# Patient Record
Sex: Female | Born: 1995
Health system: Southern US, Community
[De-identification: ages and names within clinical notes are randomized; demographics above are authoritative.]

## PROBLEM LIST (undated history)

## (undated) DIAGNOSIS — K219 Gastro-esophageal reflux disease without esophagitis: Secondary | ICD-10-CM

## (undated) DIAGNOSIS — F419 Anxiety disorder, unspecified: Secondary | ICD-10-CM

## (undated) DIAGNOSIS — K589 Irritable bowel syndrome without diarrhea: Secondary | ICD-10-CM

## (undated) DIAGNOSIS — I10 Essential (primary) hypertension: Secondary | ICD-10-CM

## (undated) DIAGNOSIS — E669 Obesity, unspecified: Secondary | ICD-10-CM

## (undated) DIAGNOSIS — A749 Chlamydial infection, unspecified: Secondary | ICD-10-CM

## (undated) DIAGNOSIS — J45909 Unspecified asthma, uncomplicated: Secondary | ICD-10-CM

## (undated) HISTORY — DX: Chlamydial infection, unspecified: A74.9

## (undated) HISTORY — DX: Gastro-esophageal reflux disease without esophagitis: K21.9

## (undated) HISTORY — DX: Obesity, unspecified: E66.9

## (undated) HISTORY — DX: Anxiety disorder, unspecified: F41.9

## (undated) HISTORY — DX: Irritable bowel syndrome, unspecified: K58.9

## (undated) HISTORY — DX: Essential (primary) hypertension: I10

---
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
## 2015-01-07 ENCOUNTER — Encounter: Payer: Self-pay | Admitting: Emergency Medicine

## 2015-01-07 ENCOUNTER — Emergency Department
Admission: EM | Admit: 2015-01-07 | Discharge: 2015-01-08 | Disposition: A | Discharge status: Home / Self Care | Payer: Medicaid Other | Attending: Emergency Medicine | Admitting: Emergency Medicine

## 2015-01-07 DIAGNOSIS — I10 Essential (primary) hypertension: Secondary | ICD-10-CM | POA: Diagnosis not present

## 2015-01-07 DIAGNOSIS — Z79899 Other long term (current) drug therapy: Secondary | ICD-10-CM | POA: Insufficient documentation

## 2015-01-07 DIAGNOSIS — J40 Bronchitis, not specified as acute or chronic: Secondary | ICD-10-CM

## 2015-01-07 DIAGNOSIS — J4521 Mild intermittent asthma with (acute) exacerbation: Secondary | ICD-10-CM

## 2015-01-07 DIAGNOSIS — R05 Cough: Secondary | ICD-10-CM | POA: Diagnosis present

## 2015-01-07 NOTE — ED Notes (Signed)
Patient ambulatory to triage with steady gait, without difficulty or distress noted; pt reports productive cough green sputum x 4 days; denies fever

## 2015-01-08 MED ORDER — ALBUTEROL SULFATE HFA 108 (90 BASE) MCG/ACT IN AERS: 2.0000 | INHALATION_SPRAY | RESPIRATORY_TRACT | Status: DC | PRN

## 2015-01-08 MED ORDER — PREDNISONE 20 MG PO TABS: ORAL_TABLET | ORAL | Status: DC

## 2015-01-08 MED ORDER — AZITHROMYCIN 250 MG PO TABS: 250.0000 mg | ORAL_TABLET | Freq: Every day | ORAL | Status: DC

## 2015-01-08 MED ORDER — PREDNISONE 20 MG PO TABS
60.0000 mg | ORAL_TABLET | Freq: Once | ORAL | Status: AC
  Administered 2015-01-08: 60 mg via ORAL
  Filled 2015-01-08: qty 3

## 2015-01-08 MED ORDER — HYDROCOD POLST-CPM POLST ER 10-8 MG/5ML PO SUER
5.0000 mL | Freq: Once | ORAL | Status: AC
  Administered 2015-01-08: 5 mL via ORAL
  Filled 2015-01-08: qty 5

## 2015-01-08 MED ORDER — IPRATROPIUM-ALBUTEROL 0.5-2.5 (3) MG/3ML IN SOLN
3.0000 mL | Freq: Once | RESPIRATORY_TRACT | Status: AC
  Administered 2015-01-08: 3 mL via RESPIRATORY_TRACT
  Filled 2015-01-08: qty 3

## 2015-01-08 MED ORDER — HYDROCOD POLST-CPM POLST ER 10-8 MG/5ML PO SUER: 5.0000 mL | Freq: Two times a day (BID) | ORAL | Status: DC

## 2015-01-08 MED ORDER — AZITHROMYCIN 250 MG PO TABS
500.0000 mg | ORAL_TABLET | Freq: Once | ORAL | Status: AC
  Administered 2015-01-08: 500 mg via ORAL
  Filled 2015-01-08: qty 2

## 2015-01-08 NOTE — ED Provider Notes (Signed)
[facility] Emergency Department Provider Note  ____________________________________________  Time seen: Approximately 1:25 AM  I have reviewed the triage vital signs and the nursing notes.   HISTORY  Chief Complaint Cough    HPI Becky Jackson is a 19 y.o. female who presents to the ED from home with a chief complain of cough, runny nose and congestion. Notes symptoms 4 days. Taking over-the-counter medicines without relief. Patient has a history of "childhood asthma". Complains of cough productive of green sputum, right ear pain and sore throat. Denies associated fever, chills, chest pain, shortness of breath, abdominal pain, nausea, vomiting, diarrhea.+ sick contacts. Denies recent travel or trauma. Nothing makes her symptoms better or worse.   Past Medical History  Diagnosis Date  . Acid reflux   . Hypertension     Patient Active Problem List   Diagnosis Date Noted  . Anemia 11/16/2012  . Chronic cholecystitis 10/26/2012  . Abdominal pain, other specified site 10/25/2012    Past Surgical History  Procedure Laterality Date  . Cholecystectomy  2014    Current Outpatient Rx  Name  Route  Sig  Dispense  Refill  . Albuterol Sulfate (PROAIR HFA IN)   Inhalation   Inhale into the lungs.         Marland Kitchen atenolol (TENORMIN) 25 MG tablet   Oral   Take 25 mg by mouth daily.         . benzonatate (TESSALON PERLES) 100 MG capsule      1-2 q 8 hours prn cough   30 capsule   0   . ferrous fumarate-iron polysaccharide complex (TANDEM) 162-115.2 MG CAPS   Oral   Take 1 capsule by mouth daily with breakfast.   30 capsule   0   . montelukast (SINGULAIR) 10 MG tablet   Oral   Take 10 mg by mouth as needed.         . norelgestromin-ethinyl estradiol (ORTHO EVRA) 150-35 MCG/24HR transdermal patch   Transdermal   Place 1 patch onto the skin once a week.         Marland Kitchen omeprazole (PRILOSEC) 20 MG capsule   Oral   Take 20 mg by mouth daily.           Allergies Augmentin and Shellfish allergy  No family history on file.  Social History Social History  Substance Use Topics  . Smoking status: Never Smoker   . Smokeless tobacco: Never Used  . Alcohol Use: No    Review of Systems Constitutional: No fever/chills Eyes: No visual changes. ENT: Positive for sore throat. Cardiovascular: Denies chest pain. Respiratory: Positive for productive cough and wheezing. Denies shortness of breath. Gastrointestinal: No abdominal pain.  No nausea, no vomiting.  No diarrhea.  No constipation. Genitourinary: Negative for dysuria. Musculoskeletal: Negative for back pain. Skin: Negative for rash. Neurological: Negative for headaches, focal weakness or numbness.  10-point ROS otherwise negative.  ____________________________________________   PHYSICAL EXAM:  VITAL SIGNS: ED Triage Vitals  Enc Vitals Group     BP 01/07/15 2257 139/84 mmHg     Pulse Rate 01/07/15 2257 110     Resp 01/07/15 2257 20     Temp 01/07/15 2257 97.6 F (36.4 C)     Temp Source 01/07/15 2257 Oral     SpO2 01/07/15 2257 99 %     Weight 01/07/15 2257 210 lb (95.255 kg)     Height 01/07/15 2257 5\' 3"  (1.6 m)     Head Cir --  Peak Flow --      Pain Score 01/08/15 0032 6     Pain Loc --      Pain Edu? --      Excl. in [place]? --     Constitutional: Alert and oriented. Well appearing and in no acute distress. Eyes: Conjunctivae are normal. PERRL. EOMI. Ears: Bilateral TM dullness. Head: Atraumatic. Nose: Congestion/rhinnorhea. Mouth/Throat: Mucous membranes are moist.  Oropharynx erythematous.  There is no tonsillar swelling, exudate or peritonsillar abscess. There is no muffled voice or drooling. Slightly hoarse voice. Neck: No stridor.   Hematological/Lymphatic/Immunilogical: [place] anterior cervical lymphadenopathy. Cardiovascular: Normal rate, regular rhythm. Grossly normal heart sounds.  Good peripheral circulation. Respiratory: Normal  respiratory effort.  No retractions. Lungs with scattered wheezing bilaterally. Gastrointestinal: Soft and nontender. No distention. No abdominal bruits. No CVA tenderness. Musculoskeletal: No lower extremity tenderness nor edema.  No joint effusions. Neurologic:  Normal speech and language. No gross focal neurologic deficits are appreciated. No gait instability. Skin:  Skin is warm, dry and intact. No rash noted. Psychiatric: Mood and affect are normal. Speech and behavior are normal.  ____________________________________________   LABS (all labs ordered are listed, but only abnormal results are displayed)  Labs Reviewed - No data to display ____________________________________________  EKG  None ____________________________________________  RADIOLOGY  None ____________________________________________   PROCEDURES  Procedure(s) performed: None  Critical Care performed: No  ____________________________________________   INITIAL IMPRESSION / ASSESSMENT AND PLAN / ED COURSE  Pertinent labs & imaging results that were available during my care of the patient were reviewed by me and considered in my medical decision making (see chart for details).  19 year old female with a history of asthma who presents with cough productive of green sputum and wheezing. Will initiate nebulizer treatment, oral prednisone, azithromycin and Tussionex for cough.  ----------------------------------------- 3:03 AM on 01/08/2015 -----------------------------------------  Patient is feeling much better after nebulizer treatment. Room air saturations 98%. Wheezing greatly improved. Discussed with patient and given strict return precautions. Patient verbalizes understanding and agrees with plan of care. ____________________________________________   FINAL CLINICAL IMPRESSION(S) / ED DIAGNOSES  Final diagnoses:  Asthma, mild intermittent, with acute exacerbation  Bronchitis      Paulette Blanch, MD 01/08/15 [phone]

## 2015-01-08 NOTE — ED Notes (Signed)
meds given/  Pt receiving breathing treatment.

## 2015-01-08 NOTE — ED Notes (Signed)
States feeling better after medicines.  Pt alert.

## 2015-01-08 NOTE — Discharge Instructions (Signed)
1. Finish steroid as prescribed (prednisone 60 mg daily 4 days). 2. Finish antibiotic as prescribed (azithromycin 250 mg daily 4 days). 3. Take cough medicine as needed (Tussionex). 4. Use albuterol inhaler 2 puffs every 4 hours as needed for wheezing. 5. Return to the ER for worsening symptoms, persistent vomiting, difficulty breathing or other concerns.  Asthma, Adult Asthma is a recurring condition in which the airways tighten and narrow. Asthma can make it difficult to breathe. It can cause coughing, wheezing, and shortness of breath. Asthma episodes, also called asthma attacks, range from minor to life-threatening. Asthma cannot be cured, but medicines and lifestyle changes can help control it. CAUSES Asthma is believed to be caused by inherited (genetic) and environmental factors, but its exact cause is unknown. Asthma may be triggered by allergens, lung infections, or irritants in the air. Asthma triggers are different for each person. Common triggers include:   Animal dander.  Dust mites.  Cockroaches.  Pollen from trees or grass.  Mold.  Smoke.  Air pollutants such as dust, household cleaners, hair sprays, aerosol sprays, paint fumes, strong chemicals, or strong odors.  Cold air, weather changes, and winds (which increase molds and pollens in the air).  Strong emotional expressions such as crying or laughing hard.  Stress.  Certain medicines (such as aspirin) or types of drugs (such as beta-blockers).  Sulfites in foods and drinks. Foods and drinks that may contain sulfites include dried fruit, potato chips, and sparkling grape juice.  Infections or inflammatory conditions such as the flu, a cold, or an inflammation of the nasal membranes (rhinitis).  Gastroesophageal reflux disease (GERD).  Exercise or strenuous activity. SYMPTOMS Symptoms may occur immediately after asthma is triggered or many hours later. Symptoms include:  Wheezing.  Excessive nighttime or  early morning coughing.  Frequent or severe coughing with a common cold.  Chest tightness.  Shortness of breath. DIAGNOSIS  The diagnosis of asthma is made by a review of your medical history and a physical exam. Tests may also be performed. These may include:  Lung function studies. These tests show how much air you breathe in and out.  Allergy tests.  Imaging tests such as X-rays. TREATMENT  Asthma cannot be cured, but it can usually be controlled. Treatment involves identifying and avoiding your asthma triggers. It also involves medicines. There are 2 classes of medicine used for asthma treatment:   Controller medicines. These prevent asthma symptoms from occurring. They are usually taken every day.  Reliever or rescue medicines. These quickly relieve asthma symptoms. They are used as needed and provide short-term relief. Your health care provider will help you create an asthma action plan. An asthma action plan is a written plan for managing and treating your asthma attacks. It includes a list of your asthma triggers and how they may be avoided. It also includes information on when medicines should be taken and when their dosage should be changed. An action plan may also involve the use of a device called a peak flow meter. A peak flow meter measures how well the lungs are working. It helps you monitor your condition. HOME CARE INSTRUCTIONS   Take medicines only as directed by your health care provider. Speak with your health care provider if you have questions about how or when to take the medicines.  Use a peak flow meter as directed by your health care provider. Record and keep track of readings.  Understand and use the action plan to help minimize or stop an  asthma attack without needing to seek medical care.  Control your home environment in the following ways to help prevent asthma attacks:  Do not smoke. Avoid being exposed to secondhand smoke.  Change your heating and  air conditioning filter regularly.  Limit your use of fireplaces and wood stoves.  Get rid of pests (such as roaches and mice) and their droppings.  Throw away plants if you see mold on them.  Clean your floors and dust regularly. Use unscented cleaning products.  Try to have someone else vacuum for you regularly. Stay out of rooms while they are being vacuumed and for a short while afterward. If you vacuum, use a dust mask from a hardware store, a double-layered or microfilter vacuum cleaner bag, or a vacuum cleaner with a HEPA filter.  Replace carpet with wood, tile, or vinyl flooring. Carpet can trap dander and dust.  Use allergy-proof pillows, mattress covers, and box spring covers.  Wash bed sheets and blankets every week in hot water and dry them in a dryer.  Use blankets that are made of polyester or cotton.  Clean bathrooms and kitchens with bleach. If possible, have someone repaint the walls in these rooms with mold-resistant paint. Keep out of the rooms that are being cleaned and painted.  Wash hands frequently. SEEK MEDICAL CARE IF:   You have wheezing, shortness of breath, or a cough even if taking medicine to prevent attacks.  The colored mucus you cough up (sputum) is thicker than usual.  Your sputum changes from clear or white to yellow, green, gray, or bloody.  You have any problems that may be related to the medicines you are taking (such as a rash, itching, swelling, or trouble breathing).  You are using a reliever medicine more than 2-3 times per week.  Your peak flow is still at 50-79% of your personal best after following your action plan for 1 hour.  You have a fever. SEEK IMMEDIATE MEDICAL CARE IF:   You seem to be getting worse and are unresponsive to treatment during an asthma attack.  You are short of breath even at rest.  You get short of breath when doing very little physical activity.  You have difficulty eating, drinking, or talking due to  asthma symptoms.  You develop chest pain.  You develop a fast heartbeat.  You have a bluish color to your lips or fingernails.  You are light-headed, dizzy, or faint.  Your peak flow is less than 50% of your personal best.   This information is not intended to replace advice given to you by your health care provider. Make sure you discuss any questions you have with your health care provider.   Document Released: 01/13/2005 Document Revised: 10/04/2014 Document Reviewed: 08/12/2012 Elsevier Interactive Patient Education 2016 [employer]  Cough, Adult Coughing is a reflex that clears your throat and your airways. Coughing helps to heal and protect your lungs. It is normal to cough occasionally, but a cough that happens with other symptoms or lasts a long time may be a sign of a condition that needs treatment. A cough may last only 2-3 weeks (acute), or it may last longer than 8 weeks (chronic). CAUSES Coughing is commonly caused by:  Breathing in substances that irritate your lungs.  A viral or bacterial respiratory infection.  Allergies.  Asthma.  Postnasal drip.  Smoking.  Acid backing up from the stomach into the esophagus (gastroesophageal reflux).  Certain medicines.  Chronic lung problems, including COPD (or rarely,  lung cancer).  Other medical conditions such as heart failure. HOME CARE INSTRUCTIONS  Pay attention to any changes in your symptoms. Take these actions to help with your discomfort:  Take medicines only as told by your health care provider.  If you were prescribed an antibiotic medicine, take it as told by your health care provider. Do not stop taking the antibiotic even if you start to feel better.  Talk with your health care provider before you take a cough suppressant medicine.  Drink enough fluid to keep your urine clear or pale yellow.  If the air is dry, use a cold steam vaporizer or humidifier in your bedroom or your home to help loosen  secretions.  Avoid anything that causes you to cough at work or at home.  If your cough is worse at night, try sleeping in a semi-upright position.  Avoid cigarette smoke. If you smoke, quit smoking. If you need help quitting, ask your health care provider.  Avoid caffeine.  Avoid alcohol.  Rest as needed. SEEK MEDICAL CARE IF:   You have new symptoms.  You cough up pus.  Your cough does not get better after 2-3 weeks, or your cough gets worse.  You cannot control your cough with suppressant medicines and you are losing sleep.  You develop pain that is getting worse or pain that is not controlled with pain medicines.  You have a fever.  You have unexplained weight loss.  You have night sweats. SEEK IMMEDIATE MEDICAL CARE IF:  You cough up blood.  You have difficulty breathing.  Your heartbeat is very fast.   This information is not intended to replace advice given to you by your health care provider. Make sure you discuss any questions you have with your health care provider.   Document Released: 07/12/2010 Document Revised: 10/04/2014 Document Reviewed: 03/22/2014 Elsevier Interactive Patient Education [employer].

## 2015-01-08 NOTE — ED Notes (Signed)
Pt has a cough, runny nose and congestion.  Sx for 4 days.  Pt taking otc meds without relief.  Nonsmoker.  No fever .  Pt alert.

## 2015-03-21 ENCOUNTER — Encounter: Payer: Self-pay | Admitting: Emergency Medicine

## 2015-03-21 ENCOUNTER — Emergency Department
Admission: EM | Admit: 2015-03-21 | Discharge: 2015-03-21 | Disposition: A | Discharge status: Home / Self Care | Payer: Medicaid Other | Attending: Emergency Medicine | Admitting: Emergency Medicine

## 2015-03-21 DIAGNOSIS — R1013 Epigastric pain: Secondary | ICD-10-CM | POA: Diagnosis not present

## 2015-03-21 DIAGNOSIS — Z79899 Other long term (current) drug therapy: Secondary | ICD-10-CM | POA: Insufficient documentation

## 2015-03-21 DIAGNOSIS — Z792 Long term (current) use of antibiotics: Secondary | ICD-10-CM | POA: Insufficient documentation

## 2015-03-21 DIAGNOSIS — J111 Influenza due to unidentified influenza virus with other respiratory manifestations: Secondary | ICD-10-CM | POA: Diagnosis not present

## 2015-03-21 DIAGNOSIS — R05 Cough: Secondary | ICD-10-CM | POA: Diagnosis present

## 2015-03-21 DIAGNOSIS — Z3202 Encounter for pregnancy test, result negative: Secondary | ICD-10-CM | POA: Diagnosis not present

## 2015-03-21 DIAGNOSIS — Z793 Long term (current) use of hormonal contraceptives: Secondary | ICD-10-CM | POA: Diagnosis not present

## 2015-03-21 DIAGNOSIS — I10 Essential (primary) hypertension: Secondary | ICD-10-CM | POA: Diagnosis not present

## 2015-03-21 DIAGNOSIS — R69 Illness, unspecified: Secondary | ICD-10-CM

## 2015-03-21 LAB — COMPREHENSIVE METABOLIC PANEL
ALBUMIN: 4.5 g/dL (ref 3.5–5.0)
ALK PHOS: 102 U/L (ref 38–126)
ALT: 28 U/L (ref 14–54)
AST: 19 U/L (ref 15–41)
Anion gap: 10 (ref 5–15)
BILIRUBIN TOTAL: 0.5 mg/dL (ref 0.3–1.2)
BUN: 18 mg/dL (ref 6–20)
CO2: 20 mmol/L — ABNORMAL LOW (ref 22–32)
CREATININE: 0.76 mg/dL (ref 0.44–1.00)
Calcium: 9.2 mg/dL (ref 8.9–10.3)
Chloride: 108 mmol/L (ref 101–111)
GFR calc Af Amer: >60 mL/min (ref >60)
Glucose, Bld: 117 mg/dL — ABNORMAL HIGH (ref 65–99)
POTASSIUM: 3.9 mmol/L (ref 3.5–5.1)
Sodium: 138 mmol/L (ref 135–145)
TOTAL PROTEIN: 7.9 g/dL (ref 6.5–8.1)

## 2015-03-21 LAB — CBC
HCT: 44.8 % (ref 35.0–47.0)
Hemoglobin: 14.6 g/dL (ref 12.0–16.0)
MCH: 27.7 pg (ref 26.0–34.0)
MCHC: 32.6 g/dL (ref 32.0–36.0)
MCV: 84.9 fL (ref 80.0–100.0)
PLATELETS: 267 10*3/uL (ref 150–440)
RBC: 5.28 MIL/uL — ABNORMAL HIGH (ref 3.80–5.20)
RDW: 14.3 % (ref 11.5–14.5)
WBC: 12.9 10*3/uL — AB (ref 3.6–11.0)

## 2015-03-21 LAB — URINALYSIS COMPLETE WITH MICROSCOPIC (ARMC ONLY)
Bacteria, UA: NONE SEEN
Bilirubin Urine: NEGATIVE
GLUCOSE, UA: NEGATIVE mg/dL
Hgb urine dipstick: NEGATIVE
KETONES UR: NEGATIVE mg/dL
NITRITE: NEGATIVE
Protein, ur: NEGATIVE mg/dL
SPECIFIC GRAVITY, URINE: 1.027 (ref 1.005–1.030)
pH: 5 (ref 5.0–8.0)

## 2015-03-21 LAB — LIPASE, BLOOD: Lipase: 22 U/L (ref 11–51)

## 2015-03-21 LAB — POCT PREGNANCY, URINE: PREG TEST UR: NEGATIVE

## 2015-03-21 MED ORDER — ONDANSETRON 8 MG PO TBDP
8.0000 mg | ORAL_TABLET | Freq: Once | ORAL | Status: AC
  Administered 2015-03-21: 8 mg via ORAL
  Filled 2015-03-21: qty 1

## 2015-03-21 MED ORDER — GI COCKTAIL ~~LOC~~
30.0000 mL | ORAL | Status: AC
  Administered 2015-03-21: 30 mL via ORAL
  Filled 2015-03-21: qty 30

## 2015-03-21 MED ORDER — ONDANSETRON 4 MG PO TBDP
4.0000 mg | ORAL_TABLET | Freq: Once | ORAL | Status: AC
  Administered 2015-03-21: 4 mg via ORAL

## 2015-03-21 MED ORDER — OXYCODONE-ACETAMINOPHEN 5-325 MG PO TABS
1.0000 | ORAL_TABLET | Freq: Once | ORAL | Status: AC
  Administered 2015-03-21: 1 via ORAL

## 2015-03-21 MED ORDER — RANITIDINE HCL 150 MG PO CAPS: 150.0000 mg | ORAL_CAPSULE | Freq: Two times a day (BID) | ORAL | Status: DC

## 2015-03-21 MED ORDER — ONDANSETRON 8 MG PO TBDP: 8.0000 mg | ORAL_TABLET | Freq: Three times a day (TID) | ORAL | Status: DC | PRN

## 2015-03-21 MED ORDER — FAMOTIDINE 20 MG PO TABS
40.0000 mg | ORAL_TABLET | Freq: Once | ORAL | Status: AC
  Administered 2015-03-21: 40 mg via ORAL
  Filled 2015-03-21: qty 2

## 2015-03-21 MED ORDER — NITROFURANTOIN MACROCRYSTAL 100 MG PO CAPS: 100.0000 mg | ORAL_CAPSULE | Freq: Two times a day (BID) | ORAL | Status: DC

## 2015-03-21 MED ORDER — OXYCODONE-ACETAMINOPHEN 5-325 MG PO TABS
ORAL_TABLET | ORAL | Status: AC
  Filled 2015-03-21: qty 1

## 2015-03-21 NOTE — Discharge Instructions (Signed)
Abdominal Pain, Adult Many things can cause abdominal pain. Usually, abdominal pain is not caused by a disease and will improve without treatment. It can often be observed and treated at home. Your health care provider will do a physical exam and possibly order blood tests and X-rays to help determine the seriousness of your pain. However, in many cases, more time must pass before a clear cause of the pain can be found. Before that point, your health care provider may not know if you need more testing or further treatment. HOME CARE INSTRUCTIONS Monitor your abdominal pain for any changes. The following actions may help to alleviate any discomfort you are experiencing:  Only take over-the-counter or prescription medicines as directed by your health care provider.  Do not take laxatives unless directed to do so by your health care provider.  Try a clear liquid diet (broth, tea, or water) as directed by your health care provider. Slowly move to a bland diet as tolerated. SEEK MEDICAL CARE IF:  You have unexplained abdominal pain.  You have abdominal pain associated with nausea or diarrhea.  You have pain when you urinate or have a bowel movement.  You experience abdominal pain that wakes you in the night.  You have abdominal pain that is worsened or improved by eating food.  You have abdominal pain that is worsened with eating fatty foods.  You have a fever. SEEK IMMEDIATE MEDICAL CARE IF:  Your pain does not go away within 2 hours.  You keep throwing up (vomiting).  Your pain is felt only in portions of the abdomen, such as the right side or the left lower portion of the abdomen.  You pass bloody or black tarry stools. MAKE SURE YOU:  Understand these instructions.  Will watch your condition.  Will get help right away if you are not doing well or get worse.   This information is not intended to replace advice given to you by your health care provider. Make sure you discuss  any questions you have with your health care provider.   Document Released: 10/23/2004 Document Revised: 10/04/2014 Document Reviewed: 09/22/2012 Elsevier Interactive Patient Education 2016 [employer]  Influenza, Adult Influenza ("the flu") is a viral infection of the respiratory tract. It occurs more often in winter months because people spend more time in close contact with one another. Influenza can make you feel very sick. Influenza easily spreads from person to person (contagious). CAUSES  Influenza is caused by a virus that infects the respiratory tract. You can catch the virus by breathing in droplets from an infected person's cough or sneeze. You can also catch the virus by touching something that was recently contaminated with the virus and then touching your mouth, nose, or eyes. RISKS AND COMPLICATIONS You may be at risk for a more severe case of influenza if you smoke cigarettes, have diabetes, have chronic heart disease (such as heart failure) or lung disease (such as asthma), or if you have a weakened immune system. Elderly people and pregnant women are also at risk for more serious infections. The most common problem of influenza is a lung infection (pneumonia). Sometimes, this problem can require emergency medical care and may be life threatening. SIGNS AND SYMPTOMS  Symptoms typically last 4 to 10 days and may include:  Fever.  Chills.  Headache, body aches, and muscle aches.  Sore throat.  Chest discomfort and cough.  Poor appetite.  Weakness or feeling tired.  Dizziness.  Nausea or vomiting. DIAGNOSIS  Diagnosis of influenza is often made based on your history and a physical exam. A nose or throat swab test can be done to confirm the diagnosis. TREATMENT  In mild cases, influenza goes away on its own. Treatment is directed at relieving symptoms. For more severe cases, your health care provider may prescribe antiviral medicines to shorten the sickness.  Antibiotic medicines are not effective because the infection is caused by a virus, not by bacteria. HOME CARE INSTRUCTIONS  Take medicines only as directed by your health care provider.  Use a cool mist humidifier to make breathing easier.  Get plenty of rest until your temperature returns to normal. This usually takes 3 to 4 days.  Drink enough fluid to keep your urine clear or pale yellow.  Cover yourmouth and nosewhen coughing or sneezing,and wash your handswellto prevent thevirusfrom spreading.  Stay homefromwork orschool untilthe fever is gonefor at least 44full day. PREVENTION  An annual influenza vaccination (flu shot) is the best way to avoid getting influenza. An annual flu shot is now routinely recommended for all adults in the [place] IF:  You experiencechest pain, yourcough worsens,or you producemore mucus.  Youhave nausea,vomiting, ordiarrhea.  Your fever returns or gets worse. SEEK IMMEDIATE MEDICAL CARE IF:  You havetrouble breathing, you become short of breath,or your skin ornails becomebluish.  You have severe painor stiffnessin the neck.  You develop a sudden headache, or pain in the face or ear.  You have nausea or vomiting that you cannot control. MAKE SURE YOU:   Understand these instructions.  Will watch your condition.  Will get help right away if you are not doing well or get worse.   This information is not intended to replace advice given to you by your health care provider. Make sure you discuss any questions you have with your health care provider.   Document Released: 01/11/2000 Document Revised: 02/03/2014 Document Reviewed: 04/14/2011 Elsevier Interactive Patient Education [employer].

## 2015-03-21 NOTE — ED Notes (Signed)
Pt reports upper abdominal pain since Saturday with nausea and vomiting.Pt tearful in triage.

## 2015-03-21 NOTE — ED Provider Notes (Signed)
[facility] Emergency Department Provider Note  ____________________________________________  Time seen: 1:15 PM  I have reviewed the triage vital signs and the nursing notes.   HISTORY  Chief Complaint Abdominal Pain    HPI Becky Jackson is a 20 y.o. female who complains of upper abdominal pain for the past 5 days with nausea and vomiting. She is also having body aches as well as some nonproductive cough and rhinorrhea over the past few days. Denies chest pain or shortness of breath. Has had loose bowel movements as well. No recent injuries.  Not currently taking any medications for the past 11 months.     Past Medical History  Diagnosis Date  . Acid reflux   . Hypertension      Patient Active Problem List   Diagnosis Date Noted  . Anemia 11/16/2012  . Chronic cholecystitis 10/26/2012  . Abdominal pain, other specified site 10/25/2012     Past Surgical History  Procedure Laterality Date  . Cholecystectomy  2014     Current Outpatient Rx  Name  Route  Sig  Dispense  Refill  . albuterol (PROVENTIL HFA;VENTOLIN HFA) 108 (90 BASE) MCG/ACT inhaler   Inhalation   Inhale 2 puffs into the lungs every 4 (four) hours as needed for wheezing or shortness of breath.   1 Inhaler   0   . atenolol (TENORMIN) 25 MG tablet   Oral   Take 25 mg by mouth daily.         Marland Kitchen azithromycin (ZITHROMAX) 250 MG tablet   Oral   Take 1 tablet (250 mg total) by mouth daily.   4 each   0   . chlorpheniramine-HYDROcodone (TUSSIONEX PENNKINETIC ER) 10-8 MG/5ML SUER   Oral   Take 5 mLs by mouth 2 (two) times daily.   70 mL   0   . montelukast (SINGULAIR) 10 MG tablet   Oral   Take 10 mg by mouth as needed.         . norelgestromin-ethinyl estradiol (ORTHO EVRA) 150-35 MCG/24HR transdermal patch   Transdermal   Place 1 patch onto the skin once a week.         Marland Kitchen omeprazole (PRILOSEC) 20 MG capsule   Oral   Take 20 mg by mouth daily.          . ondansetron (ZOFRAN ODT) 8 MG disintegrating tablet   Oral   Take 1 tablet (8 mg total) by mouth every 8 (eight) hours as needed for nausea or vomiting.   20 tablet   0   . predniSONE (DELTASONE) 20 MG tablet      3 tablets daily 4 days   12 tablet   0   . ranitidine (ZANTAC) 150 MG capsule   Oral   Take 1 capsule (150 mg total) by mouth 2 (two) times daily.   28 capsule   0      Allergies Augmentin and Shellfish allergy   No family history on file.  Social History Social History  Substance Use Topics  . Smoking status: Never Smoker   . Smokeless tobacco: Never Used  . Alcohol Use: No    Review of Systems  Constitutional:   No fever or chills. No weight changes Eyes:   No blurry vision or double vision.  ENT:   Positive rhinorrhea no sore throat.  Cardiovascular:   No chest pain. Respiratory:   No dyspnea positive nonproductive cough. Gastrointestinal:   Positive epigastric pain with vomiting and  loose bowel movements.  No BRBPR or melena. Genitourinary:   Negative for dysuria or difficulty urinating. Musculoskeletal:  Positive myalgia Skin:   Negative for rash. Neurological:   Negative for headaches, focal weakness or numbness. Psychiatric:  No anxiety or depression.   Endocrine:  No changes in energy or sleep difficulty.  10-point ROS otherwise negative.  ____________________________________________   PHYSICAL EXAM:  VITAL SIGNS: ED Triage Vitals  Enc Vitals Group     BP 03/21/15 1145 138/95 mmHg     Pulse Rate 03/21/15 1145 120     Resp 03/21/15 1145 24     Temp 03/21/15 1143 98.1 F (36.7 C)     Temp Source 03/21/15 1143 Oral     SpO2 03/21/15 1145 98 %     Weight 03/21/15 1143 220 lb (99.791 kg)     Height 03/21/15 1143 5\' 2"  (1.575 m)     Head Cir --      Peak Flow --      Pain Score 03/21/15 1144 8     Pain Loc --      Pain Edu? --      Excl. in [place]? --     Vital signs reviewed, nursing assessments  reviewed.   Constitutional:   Alert and oriented. Well appearing and in no distress. Eyes:   No scleral icterus. No conjunctival pallor. PERRL. EOMI ENT   Head:   Normocephalic and atraumatic.   Nose:   Positive nasal congestion. No septal hematoma   Mouth/Throat:   MMM, positive pharyngeal erythema. No peritonsillar mass.    Neck:   No stridor. No SubQ emphysema. No meningismus. Hematological/Lymphatic/Immunilogical:   No cervical lymphadenopathy. Cardiovascular:   RRR. Symmetric bilateral radial and DP pulses.  No murmurs.  Respiratory:   Normal respiratory effort without tachypnea nor retractions. Breath sounds are clear and equal bilaterally. No wheezes/rales/rhonchi. Gastrointestinal:   Soft with epigastric tenderness. Non distended. There is no CVA tenderness.  No rebound, rigidity, or guarding. Genitourinary:   deferred Musculoskeletal:   Nontender with normal range of motion in all extremities. No joint effusions.  No lower extremity tenderness.  No edema. Neurologic:   Normal speech and language.  CN 2-10 normal. Motor grossly intact. No gross focal neurologic deficits are appreciated.  Skin:    Skin is warm, dry and intact. No rash noted.  No petechiae, purpura, or bullae. Psychiatric:   Mood and affect are normal. ____________________________________________    LABS (pertinent positives/negatives) (all labs ordered are listed, but only abnormal results are displayed) Labs Reviewed  COMPREHENSIVE METABOLIC PANEL - Abnormal; Notable for the following:    CO2 20 (*)    Glucose, Bld 117 (*)    All other components within normal limits  CBC - Abnormal; Notable for the following:    WBC 12.9 (*)    RBC 5.28 (*)    All other components within normal limits  URINALYSIS COMPLETEWITH MICROSCOPIC ([facility] ONLY) - Abnormal; Notable for the following:    Color, Urine YELLOW (*)    APPearance HAZY (*)    Leukocytes, UA 1+ (*)    Squamous Epithelial / LPF 6-30 (*)     All other components within normal limits  LIPASE, BLOOD  POC URINE PREG, ED  POCT PREGNANCY, URINE   ____________________________________________   EKG    ____________________________________________    RADIOLOGY    ____________________________________________   PROCEDURES   ____________________________________________   INITIAL IMPRESSION / ASSESSMENT AND PLAN / ED COURSE  Pertinent labs &  imaging results that were available during my care of the patient were reviewed by me and considered in my medical decision making (see chart for details).  Patient presents with constellation of symptoms consistent with influenza-like illness.Considering the patient's symptoms, medical history, and physical examination today, I have low suspicion for cholecystitis or biliary pathology, pancreatitis, perforation or bowel obstruction, hernia, intra-abdominal abscess, AAA or dissection, volvulus or intussusception, mesenteric ischemia, or appendicitis.  After antiemetics and antacids, patient is tolerating oral intake and feeling much better. Tachycardia in triage has resolved. We'll discharge home with Zofran and Zantac. Additionally, her urinalysis is somewhat equivocal for urinary tract infection. She does state that she's been having some dysuria recently so we'll put her on a short course of Macrobid.     ____________________________________________   FINAL CLINICAL IMPRESSION(S) / ED DIAGNOSES  Final diagnoses:  Influenza-like illness  Acute epigastric pain      Carrie Mew, MD 03/21/15 1447

## 2015-03-21 NOTE — ED Notes (Signed)
Patient given Ginger Ale to sip to see if she is able to tolerate fluids.

## 2015-07-16 ENCOUNTER — Emergency Department
Admission: EM | Admit: 2015-07-16 | Discharge: 2015-07-16 | Disposition: A | Discharge status: Home / Self Care | Payer: Medicaid Other | Attending: Emergency Medicine | Admitting: Emergency Medicine

## 2015-07-16 ENCOUNTER — Encounter: Payer: Self-pay | Admitting: Emergency Medicine

## 2015-07-16 ENCOUNTER — Emergency Department: Payer: Medicaid Other

## 2015-07-16 DIAGNOSIS — Z7952 Long term (current) use of systemic steroids: Secondary | ICD-10-CM | POA: Diagnosis not present

## 2015-07-16 DIAGNOSIS — R519 Headache, unspecified: Secondary | ICD-10-CM

## 2015-07-16 DIAGNOSIS — Z79899 Other long term (current) drug therapy: Secondary | ICD-10-CM | POA: Insufficient documentation

## 2015-07-16 DIAGNOSIS — Z792 Long term (current) use of antibiotics: Secondary | ICD-10-CM | POA: Diagnosis not present

## 2015-07-16 DIAGNOSIS — I10 Essential (primary) hypertension: Secondary | ICD-10-CM | POA: Insufficient documentation

## 2015-07-16 DIAGNOSIS — R51 Headache: Secondary | ICD-10-CM | POA: Insufficient documentation

## 2015-07-16 LAB — BASIC METABOLIC PANEL
ANION GAP: 9 (ref 5–15)
BUN: 14 mg/dL (ref 6–20)
CALCIUM: 9.1 mg/dL (ref 8.9–10.3)
CO2: 23 mmol/L (ref 22–32)
CREATININE: 0.69 mg/dL (ref 0.44–1.00)
Chloride: 106 mmol/L (ref 101–111)
GFR calc Af Amer: >60 mL/min (ref >60)
GLUCOSE: 104 mg/dL — AB (ref 65–99)
Potassium: 3.7 mmol/L (ref 3.5–5.1)
Sodium: 138 mmol/L (ref 135–145)

## 2015-07-16 LAB — URINALYSIS COMPLETE WITH MICROSCOPIC (ARMC ONLY)
Bilirubin Urine: NEGATIVE
Glucose, UA: NEGATIVE mg/dL
Ketones, ur: NEGATIVE mg/dL
LEUKOCYTES UA: NEGATIVE
Nitrite: NEGATIVE
PH: 6 (ref 5.0–8.0)
PROTEIN: NEGATIVE mg/dL
SPECIFIC GRAVITY, URINE: 1.016 (ref 1.005–1.030)

## 2015-07-16 LAB — CBC WITH DIFFERENTIAL/PLATELET
BASOS PCT: 1 %
Basophils Absolute: 0.1 10*3/uL (ref 0–0.1)
EOS PCT: 2 %
Eosinophils Absolute: 0.2 10*3/uL (ref 0–0.7)
HCT: 44.3 % (ref 35.0–47.0)
Hemoglobin: 14.7 g/dL (ref 12.0–16.0)
LYMPHS PCT: 37 %
Lymphs Abs: 3.7 10*3/uL — ABNORMAL HIGH (ref 1.0–3.6)
MCH: 28.7 pg (ref 26.0–34.0)
MCHC: 33.1 g/dL (ref 32.0–36.0)
MCV: 86.9 fL (ref 80.0–100.0)
MONO ABS: 0.5 10*3/uL (ref 0.2–0.9)
MONOS PCT: 5 %
Neutro Abs: 5.5 10*3/uL (ref 1.4–6.5)
Neutrophils Relative %: 55 %
PLATELETS: 237 10*3/uL (ref 150–440)
RBC: 5.1 MIL/uL (ref 3.80–5.20)
RDW: 13.5 % (ref 11.5–14.5)
WBC: 9.9 10*3/uL (ref 3.6–11.0)

## 2015-07-16 LAB — POCT PREGNANCY, URINE: PREG TEST UR: NEGATIVE

## 2015-07-16 MED ORDER — PROCHLORPERAZINE EDISYLATE 5 MG/ML IJ SOLN
10.0000 mg | Freq: Once | INTRAMUSCULAR | Status: AC
  Administered 2015-07-16: 10 mg via INTRAVENOUS
  Filled 2015-07-16: qty 2

## 2015-07-16 MED ORDER — IBUPROFEN 400 MG PO TABS
400.0000 mg | ORAL_TABLET | Freq: Once | ORAL | Status: AC
  Administered 2015-07-16: 400 mg via ORAL

## 2015-07-16 MED ORDER — BUTALBITAL-APAP-CAFFEINE 50-325-40 MG PO TABS: 1.0000 | ORAL_TABLET | Freq: Four times a day (QID) | ORAL | Status: AC | PRN

## 2015-07-16 MED ORDER — SODIUM CHLORIDE 0.9 % IV BOLUS (SEPSIS)
1000.0000 mL | Freq: Once | INTRAVENOUS | Status: AC
  Administered 2015-07-16: 1000 mL via INTRAVENOUS

## 2015-07-16 MED ORDER — IBUPROFEN 400 MG PO TABS
ORAL_TABLET | ORAL | Status: AC
  Filled 2015-07-16: qty 1

## 2015-07-16 NOTE — ED Provider Notes (Addendum)
[facility] Emergency Department Provider Note   ____________________________________________  Time seen: Approximately 1:15 PM  I have reviewed the triage vital signs and the nursing notes.   HISTORY  Chief Complaint Headache   HPI Becky Jackson is a 20 y.o. female with a history of hypertension as well as acid reflux was presenting to the emergency department today with right sided headache. Says that the headaches are this past Friday and was mild and then this slowly progressed. She says it is now 9 out of 10 and pounding. Associated nausea as well as photophobia. Denies any history of headache. Denies any factors that could've contributed to this such as stress.Did not have a sudden onset of the worst headache of her life. Denies any weakness or numbness. Says the headache extends on the right side of her head from the forehead to the occiput.   Past Medical History  Diagnosis Date  . Acid reflux   . Hypertension     Patient Active Problem List   Diagnosis Date Noted  . Anemia 11/16/2012  . Chronic cholecystitis 10/26/2012  . Abdominal pain, other specified site 10/25/2012    Past Surgical History  Procedure Laterality Date  . Cholecystectomy  2014    Current Outpatient Rx  Name  Route  Sig  Dispense  Refill  . albuterol (PROVENTIL HFA;VENTOLIN HFA) 108 (90 BASE) MCG/ACT inhaler   Inhalation   Inhale 2 puffs into the lungs every 4 (four) hours as needed for wheezing or shortness of breath.   1 Inhaler   0   . atenolol (TENORMIN) 25 MG tablet   Oral   Take 25 mg by mouth daily.         Marland Kitchen azithromycin (ZITHROMAX) 250 MG tablet   Oral   Take 1 tablet (250 mg total) by mouth daily.   4 each   0   . chlorpheniramine-HYDROcodone (TUSSIONEX PENNKINETIC ER) 10-8 MG/5ML SUER   Oral   Take 5 mLs by mouth 2 (two) times daily.   70 mL   0   . montelukast (SINGULAIR) 10 MG tablet   Oral   Take 10 mg by mouth as needed.           . nitrofurantoin (MACRODANTIN) 100 MG capsule   Oral   Take 1 capsule (100 mg total) by mouth 2 (two) times daily.   6 capsule   0   . norelgestromin-ethinyl estradiol (ORTHO EVRA) 150-35 MCG/24HR transdermal patch   Transdermal   Place 1 patch onto the skin once a week.         Marland Kitchen omeprazole (PRILOSEC) 20 MG capsule   Oral   Take 20 mg by mouth daily.         . ondansetron (ZOFRAN ODT) 8 MG disintegrating tablet   Oral   Take 1 tablet (8 mg total) by mouth every 8 (eight) hours as needed for nausea or vomiting.   20 tablet   0   . predniSONE (DELTASONE) 20 MG tablet      3 tablets daily 4 days   12 tablet   0   . ranitidine (ZANTAC) 150 MG capsule   Oral   Take 1 capsule (150 mg total) by mouth 2 (two) times daily.   28 capsule   0     Allergies Augmentin and Shellfish allergy  No family history on file.  Social History Social History  Substance Use Topics  . Smoking status: Never Smoker   .  Smokeless tobacco: Never Used  . Alcohol Use: No    Review of Systems Constitutional: No fever/chills Eyes: No visual changes. ENT: No sore throat. Cardiovascular: Denies chest pain. Respiratory: Denies shortness of breath. Gastrointestinal: No abdominal pain. no vomiting.  No diarrhea.  No constipation. Genitourinary: Negative for dysuria. Musculoskeletal: Negative for back pain. Skin: Negative for rash. Neurological: Negative for focal weakness or numbness.  10-point ROS otherwise negative.  ____________________________________________   PHYSICAL EXAM:  VITAL SIGNS: ED Triage Vitals  Enc Vitals Group     BP 07/16/15 1125 130/82 mmHg     Pulse Rate 07/16/15 1125 118     Resp 07/16/15 1125 18     Temp 07/16/15 1125 98.8 F (37.1 C)     Temp Source 07/16/15 1125 Oral     SpO2 07/16/15 1125 99 %     Weight 07/16/15 1125 210 lb (95.255 kg)     Height 07/16/15 1125 5\' 3"  (1.6 m)     Head Cir --      Peak Flow --      Pain Score 07/16/15 1124 9      Pain Loc --      Pain Edu? --      Excl. in [place]? --     Constitutional: Alert and oriented. Well appearing and in no acute distress. Eyes: Conjunctivae are normal. PERRL. EOMI. Head: Atraumatic. Nose: No congestion/rhinnorhea. Mouth/Throat: Mucous membranes are moist.   Neck: No stridor.   Cardiovascular: Normal rate, regular rhythm. Grossly normal heart sounds.   Respiratory: Normal respiratory effort.  No retractions. Lungs CTAB. Gastrointestinal: Soft and nontender. No distention.  Musculoskeletal: No lower extremity tenderness nor edema.  No joint effusions. Neurologic:  Normal speech and language. No gross focal neurologic deficits are appreciated Skin:  Skin is warm, dry and intact. No rash noted. Psychiatric: Mood and affect are normal. Speech and behavior are normal.  ____________________________________________   LABS (all labs ordered are listed, but only abnormal results are displayed)  Labs Reviewed  CBC WITH DIFFERENTIAL/PLATELET - Abnormal; Notable for the following:    Lymphs Abs 3.7 (*)    All other components within normal limits  BASIC METABOLIC PANEL - Abnormal; Notable for the following:    Glucose, Bld 104 (*)    All other components within normal limits  URINALYSIS COMPLETEWITH MICROSCOPIC ([facility] ONLY) - Abnormal; Notable for the following:    Color, Urine YELLOW (*)    APPearance CLEAR (*)    Hgb urine dipstick 2+ (*)    Bacteria, UA RARE (*)    Squamous Epithelial / LPF 0-5 (*)    All other components within normal limits  POC URINE PREG, ED  POCT PREGNANCY, URINE   ____________________________________________  EKG   ____________________________________________  RADIOLOGY  CT Head Wo Contrast (Final result) Result time: 07/16/15 13:40:43   Final result by Rad Results In Interface (07/16/15 13:40:43)   Narrative:   CLINICAL DATA: Right-sided headache for 4 days.  EXAM: CT HEAD WITHOUT CONTRAST  TECHNIQUE: Contiguous axial images  were obtained from the base of the skull through the vertex without intravenous contrast.  COMPARISON: None.  FINDINGS: Sinuses/Soft tissues: Mucosal thickening of sphenoid sinus and ethmoid air cells. Clear mastoid air cells.  Intracranial: No mass lesion, hemorrhage, hydrocephalus, acute infarct, intra-axial, or extra-axial fluid collection.  IMPRESSION: 1. No acute intracranial abnormality. 2. Sinus disease.   Electronically Signed By: Abigail Miyamoto M.D. On: 07/16/2015 13:40       ____________________________________________   PROCEDURES  ____________________________________________   INITIAL IMPRESSION / ASSESSMENT AND PLAN / ED COURSE  Pertinent labs & imaging results that were available during my care of the patient were reviewed by me and considered in my medical decision making (see chart for details).  ----------------------------------------- 2:51 PM on 07/16/2015 -----------------------------------------  Patient with relief of her headache. Says his feeling anxious and would like to go home at this time. I offered her some Benadryl because I think part of the anxiety may be related to Compazine. However, the patient was not interested in the Benadryl this time. She says that she would just feel more comfortable going home and lying in her bed to take a nap. She is also requesting a work note. Likely migraine type headache. Will be discharged home. ____________________________________________   FINAL CLINICAL IMPRESSION(S) / ED DIAGNOSES  Headache.    NEW MEDICATIONS STARTED DURING THIS VISIT:  New Prescriptions   No medications on file     Note:  This document was prepared using Dragon voice recognition software and may include unintentional dictation errors.    Orbie Pyo, MD 07/16/15 1452  Hr is 97 bpm at this time.    Orbie Pyo, MD 07/16/15 [phone]

## 2015-07-16 NOTE — Discharge Instructions (Signed)

## 2015-07-16 NOTE — ED Notes (Signed)
Pt alert and oriented X4, active, cooperative, pt in NAD. RR even and unlabored, color WNL.  Pt informed to return if any life threatening symptoms occur.   

## 2015-07-16 NOTE — ED Notes (Signed)
C/O headache.  States head started hurting on Friday, but pain has worsened over the weekend.  C/O throbbing pain in the back of head and right side of head.

## 2015-11-03 ENCOUNTER — Encounter: Payer: Self-pay | Admitting: Emergency Medicine

## 2015-11-03 DIAGNOSIS — R05 Cough: Secondary | ICD-10-CM | POA: Diagnosis present

## 2015-11-03 DIAGNOSIS — I10 Essential (primary) hypertension: Secondary | ICD-10-CM | POA: Diagnosis not present

## 2015-11-03 DIAGNOSIS — R197 Diarrhea, unspecified: Secondary | ICD-10-CM | POA: Diagnosis not present

## 2015-11-03 DIAGNOSIS — Z79899 Other long term (current) drug therapy: Secondary | ICD-10-CM | POA: Diagnosis not present

## 2015-11-03 DIAGNOSIS — R1084 Generalized abdominal pain: Secondary | ICD-10-CM | POA: Insufficient documentation

## 2015-11-03 DIAGNOSIS — R110 Nausea: Secondary | ICD-10-CM | POA: Insufficient documentation

## 2015-11-03 DIAGNOSIS — J069 Acute upper respiratory infection, unspecified: Secondary | ICD-10-CM | POA: Insufficient documentation

## 2015-11-03 LAB — URINALYSIS COMPLETE WITH MICROSCOPIC (ARMC ONLY)
BILIRUBIN URINE: NEGATIVE
GLUCOSE, UA: NEGATIVE mg/dL
HGB URINE DIPSTICK: NEGATIVE
KETONES UR: NEGATIVE mg/dL
LEUKOCYTES UA: NEGATIVE
NITRITE: NEGATIVE
Protein, ur: NEGATIVE mg/dL
SPECIFIC GRAVITY, URINE: 1.028 (ref 1.005–1.030)
pH: 6 (ref 5.0–8.0)

## 2015-11-03 LAB — COMPREHENSIVE METABOLIC PANEL
ALK PHOS: 107 U/L (ref 38–126)
ALT: 16 U/L (ref 14–54)
AST: 17 U/L (ref 15–41)
Albumin: 4.2 g/dL (ref 3.5–5.0)
Anion gap: 7 (ref 5–15)
BUN: 17 mg/dL (ref 6–20)
CALCIUM: 9.1 mg/dL (ref 8.9–10.3)
CHLORIDE: 104 mmol/L (ref 101–111)
CO2: 26 mmol/L (ref 22–32)
CREATININE: 0.84 mg/dL (ref 0.44–1.00)
Glucose, Bld: 102 mg/dL — ABNORMAL HIGH (ref 65–99)
Potassium: 3.8 mmol/L (ref 3.5–5.1)
Sodium: 137 mmol/L (ref 135–145)
TOTAL PROTEIN: 7.6 g/dL (ref 6.5–8.1)
Total Bilirubin: 0.5 mg/dL (ref 0.3–1.2)

## 2015-11-03 LAB — CBC
HEMATOCRIT: 44.6 % (ref 35.0–47.0)
Hemoglobin: 15.3 g/dL (ref 12.0–16.0)
MCH: 30.2 pg (ref 26.0–34.0)
MCHC: 34.4 g/dL (ref 32.0–36.0)
MCV: 87.8 fL (ref 80.0–100.0)
PLATELETS: 246 10*3/uL (ref 150–440)
RBC: 5.08 MIL/uL (ref 3.80–5.20)
RDW: 13.2 % (ref 11.5–14.5)
WBC: 14.5 10*3/uL — AB (ref 3.6–11.0)

## 2015-11-03 LAB — LIPASE, BLOOD: Lipase: 21 U/L (ref 11–51)

## 2015-11-03 NOTE — ED Triage Notes (Signed)
Patient with complaint of cough, congestion, body aches, diarrhea and nausea times 5 days. Patient with complaint of generalized abd cramping.

## 2015-11-04 ENCOUNTER — Emergency Department: Payer: Medicaid Other

## 2015-11-04 ENCOUNTER — Emergency Department
Admission: EM | Admit: 2015-11-04 | Discharge: 2015-11-04 | Disposition: A | Discharge status: Home / Self Care | Payer: Medicaid Other | Attending: Emergency Medicine | Admitting: Emergency Medicine

## 2015-11-04 DIAGNOSIS — B9789 Other viral agents as the cause of diseases classified elsewhere: Secondary | ICD-10-CM

## 2015-11-04 DIAGNOSIS — J069 Acute upper respiratory infection, unspecified: Secondary | ICD-10-CM

## 2015-11-04 MED ORDER — ONDANSETRON 4 MG PO TBDP
4.0000 mg | ORAL_TABLET | Freq: Once | ORAL | Status: AC
  Administered 2015-11-04: 4 mg via ORAL
  Filled 2015-11-04: qty 1

## 2015-11-04 MED ORDER — HYDROCOD POLST-CPM POLST ER 10-8 MG/5ML PO SUER: 5.0000 mL | Freq: Two times a day (BID) | ORAL | 0 refills | Status: DC

## 2015-11-04 MED ORDER — HYDROCOD POLST-CPM POLST ER 10-8 MG/5ML PO SUER
5.0000 mL | Freq: Once | ORAL | Status: AC
  Administered 2015-11-04: 5 mL via ORAL
  Filled 2015-11-04: qty 5

## 2015-11-04 NOTE — Discharge Instructions (Signed)
1. You may take Tussionex as needed for cough. 2. Drink plenty of fluids daily. 3. Return to the ER for worsening symptoms, persistent vomiting, difficulty breathing or other concerns.

## 2015-11-04 NOTE — ED Provider Notes (Signed)
[facility] Emergency Department Provider Note   ____________________________________________   First MD Initiated Contact with Patient 11/04/15 0200     (approximate)  I have reviewed the triage vital signs and the nursing notes.   HISTORY  Chief Complaint Nasal Congestion; Cough; Abdominal Pain; Generalized Body Aches; Diarrhea; and Nausea    HPI Becky Jackson is a 20 y.o. female who presents to the ED from home with a chief complaint of cough, congestion, body aches, diarrhea and nausea. Patient has had a cough for the past 2 weeks; initially was productive of dark green sputum, now nonproductive. Presents with nasal congestion and sinus pain, body aches, generalized abdominal cramping, diarrhea and nausea for the past 5 days. Denies fever, chills, chest pain, shortness of breath, vomiting, diarrhea.+ sick contacts. Denies recent travel or trauma. Nothing makes her symptoms better or worse.   Past Medical History:  Diagnosis Date  . Acid reflux   . Hypertension     Patient Active Problem List   Diagnosis Date Noted  . Anemia 11/16/2012  . Chronic cholecystitis 10/26/2012  . Abdominal pain, other specified site 10/25/2012    Past Surgical History:  Procedure Laterality Date  . CHOLECYSTECTOMY  2014    Prior to Admission medications   Medication Sig Start Date End Date Taking? Authorizing Provider  albuterol (PROVENTIL HFA;VENTOLIN HFA) 108 (90 BASE) MCG/ACT inhaler Inhale 2 puffs into the lungs every 4 (four) hours as needed for wheezing or shortness of breath. 01/08/15   Paulette Blanch, MD  atenolol (TENORMIN) 25 MG tablet Take 25 mg by mouth daily.    Historical Provider, MD  azithromycin (ZITHROMAX) 250 MG tablet Take 1 tablet (250 mg total) by mouth daily. 01/08/15   Paulette Blanch, MD  butalbital-acetaminophen-caffeine [facility]) [phone] MG tablet Take 1-2 tablets by mouth every 6 (six) hours as needed for headache. 07/16/15 07/15/16  Orbie Pyo, MD  chlorpheniramine-HYDROcodone [facility] PENNKINETIC ER) 10-8 MG/5ML SUER Take 5 mLs by mouth 2 (two) times daily. 11/04/15   Paulette Blanch, MD  montelukast (SINGULAIR) 10 MG tablet Take 10 mg by mouth as needed.    Historical Provider, MD  nitrofurantoin (MACRODANTIN) 100 MG capsule Take 1 capsule (100 mg total) by mouth 2 (two) times daily. 03/21/15   Carrie Mew, MD  norelgestromin-ethinyl estradiol (ORTHO EVRA) 150-35 MCG/24HR transdermal patch Place 1 patch onto the skin once a week.    Historical Provider, MD  omeprazole (PRILOSEC) 20 MG capsule Take 20 mg by mouth daily.    Historical Provider, MD  ondansetron (ZOFRAN ODT) 8 MG disintegrating tablet Take 1 tablet (8 mg total) by mouth every 8 (eight) hours as needed for nausea or vomiting. 03/21/15   Carrie Mew, MD  predniSONE (DELTASONE) 20 MG tablet 3 tablets daily 4 days 01/08/15   Paulette Blanch, MD  ranitidine (ZANTAC) 150 MG capsule Take 1 capsule (150 mg total) by mouth 2 (two) times daily. 03/21/15   Carrie Mew, MD    Allergies Augmentin [amoxicillin-pot clavulanate] and Shellfish allergy  No family history on file.  Social History Social History  Substance Use Topics  . Smoking status: Never Smoker  . Smokeless tobacco: Never Used  . Alcohol use No    Review of Systems  Constitutional: No fever/chills. Eyes: No visual changes. ENT: Positive for sinus congestion. No sore throat. Cardiovascular: Denies chest pain. Respiratory: Positive for cough. Denies shortness of breath. Gastrointestinal: Positive for abdominal cramping.  Positive for nausea, no  vomiting.  No diarrhea.  No constipation. Genitourinary: Negative for dysuria. Musculoskeletal: Negative for back pain. Skin: Negative for rash. Neurological: Negative for headaches, focal weakness or numbness.  10-point ROS otherwise negative.  ____________________________________________   PHYSICAL EXAM:  VITAL SIGNS: ED Triage  Vitals  Enc Vitals Group     BP 11/03/15 2312 (!) 137/97     Pulse Rate 11/03/15 2312 86     Resp 11/03/15 2312 18     Temp 11/03/15 2312 97.9 F (36.6 C)     Temp Source 11/03/15 2312 Oral     SpO2 11/03/15 2312 99 %     Weight 11/03/15 2313 200 lb (90.7 kg)     Height 11/03/15 2313 5\' 3"  (1.6 m)     Head Circumference --      Peak Flow --      Pain Score 11/03/15 2313 4     Pain Loc --      Pain Edu? --      Excl. in [place]? --     Constitutional: Alert and oriented. Well appearing and in no acute distress. Eyes: Conjunctivae are normal. PERRL. EOMI. Head: Atraumatic. Nose: Congestion/rhinnorhea. Mouth/Throat: Mucous membranes are moist.  Oropharynx non-erythematous. Neck: No stridor.   Cardiovascular: Normal rate, regular rhythm. Grossly normal heart sounds.  Good peripheral circulation. Respiratory: Normal respiratory effort.  No retractions. Lungs with scattered rhonchi. Gastrointestinal: Soft and nontender. No distention. No abdominal bruits. No CVA tenderness. Musculoskeletal: No lower extremity tenderness nor edema.  No joint effusions. Neurologic:  Normal speech and language. No gross focal neurologic deficits are appreciated. No gait instability. Skin:  Skin is warm, dry and intact. No rash noted. No petechiae. Psychiatric: Mood and affect are normal. Speech and behavior are normal.  ____________________________________________   LABS (all labs ordered are listed, but only abnormal results are displayed)  Labs Reviewed  COMPREHENSIVE METABOLIC PANEL - Abnormal; Notable for the following:       Result Value   Glucose, Bld 102 (*)    All other components within normal limits  CBC - Abnormal; Notable for the following:    WBC 14.5 (*)    All other components within normal limits  URINALYSIS COMPLETEWITH MICROSCOPIC ([facility] ONLY) - Abnormal; Notable for the following:    Color, Urine YELLOW (*)    APPearance CLEAR (*)    Bacteria, UA RARE (*)    Squamous Epithelial  / LPF 0-5 (*)    All other components within normal limits  LIPASE, BLOOD   ____________________________________________  EKG  None ____________________________________________  RADIOLOGY  Chest 2 view (viewed by me, interpreted per Dr. Gerilyn Nestle): No active cardiopulmonary disease. ____________________________________________   PROCEDURES  Procedure(s) performed: None  Procedures  Critical Care performed: No  ____________________________________________   INITIAL IMPRESSION / ASSESSMENT AND PLAN / ED COURSE  Pertinent labs & imaging results that were available during my care of the patient were reviewed by me and considered in my medical decision making (see chart for details).  20 year old female who presents with symptoms suggestive of viral illness. Laboratory urinalysis results are unremarkable except for mild leukocytosis. Will add chest x-ray to evaluate pneumonia.  Clinical Course  Comment By Time  Patient resting in no acute distress. Updated her of imaging results. Strict return precautions given. Patient verbalizes understanding and agrees with plan of care. Paulette Blanch, MD 10/08 [phone]     ____________________________________________   FINAL CLINICAL IMPRESSION(S) / ED DIAGNOSES  Final diagnoses:  Viral URI with cough  NEW MEDICATIONS STARTED DURING THIS VISIT:  New Prescriptions   CHLORPHENIRAMINE-HYDROCODONE (TUSSIONEX PENNKINETIC ER) 10-8 MG/5ML SUER    Take 5 mLs by mouth 2 (two) times daily.     Note:  This document was prepared using Dragon voice recognition software and may include unintentional dictation errors.    Paulette Blanch, MD 11/04/15 [phone]

## 2015-11-04 NOTE — ED Notes (Signed)
Report to dawn, rn.  

## 2015-11-05 LAB — POCT PREGNANCY, URINE: PREG TEST UR: NEGATIVE

## 2016-01-08 ENCOUNTER — Encounter: Payer: Self-pay | Admitting: Emergency Medicine

## 2016-01-08 ENCOUNTER — Emergency Department
Admission: EM | Admit: 2016-01-08 | Discharge: 2016-01-08 | Disposition: A | Discharge status: Home / Self Care | Payer: Medicaid Other | Attending: Emergency Medicine | Admitting: Emergency Medicine

## 2016-01-08 DIAGNOSIS — I10 Essential (primary) hypertension: Secondary | ICD-10-CM | POA: Insufficient documentation

## 2016-01-08 DIAGNOSIS — Z79899 Other long term (current) drug therapy: Secondary | ICD-10-CM | POA: Diagnosis not present

## 2016-01-08 DIAGNOSIS — H6123 Impacted cerumen, bilateral: Secondary | ICD-10-CM | POA: Insufficient documentation

## 2016-01-08 DIAGNOSIS — H9192 Unspecified hearing loss, left ear: Secondary | ICD-10-CM | POA: Diagnosis present

## 2016-01-08 MED ORDER — CARBAMIDE PEROXIDE 6.5 % OT SOLN
5.0000 [drp] | Freq: Two times a day (BID) | OTIC | Status: DC
  Administered 2016-01-08: 5 [drp] via OTIC

## 2016-01-08 MED ORDER — FLUTICASONE PROPIONATE 50 MCG/ACT NA SUSP: 2.0000 | Freq: Every day | NASAL | 0 refills | Status: DC

## 2016-01-08 MED ORDER — CARBAMIDE PEROXIDE 6.5 % OT SOLN
OTIC | Status: AC
  Filled 2016-01-08: qty 15

## 2016-01-08 MED ORDER — KETOROLAC TROMETHAMINE 60 MG/2ML IM SOLN
30.0000 mg | Freq: Once | INTRAMUSCULAR | Status: AC
  Administered 2016-01-08: 30 mg via INTRAMUSCULAR
  Filled 2016-01-08: qty 2

## 2016-01-08 MED ORDER — ONDANSETRON HCL 4 MG PO TABS
4.0000 mg | ORAL_TABLET | Freq: Once | ORAL | Status: AC
  Administered 2016-01-08: 4 mg via ORAL

## 2016-01-08 MED ORDER — ONDANSETRON HCL 4 MG PO TABS
ORAL_TABLET | ORAL | Status: AC
  Administered 2016-01-08: 4 mg via ORAL
  Filled 2016-01-08: qty 1

## 2016-01-08 NOTE — ED Triage Notes (Signed)
Pt ambulatory to triage with steady gait, no distress noted. Pt c/o hearing loss in left ear x1 month, right ear x1 day, congestion and nasal drainage.

## 2016-01-08 NOTE — ED Provider Notes (Addendum)
[facility] Emergency Department Provider Note  ____________________________________________  Time seen: Approximately 8:26 PM  I have reviewed the triage vital signs and the nursing notes.   HISTORY  Chief Complaint Hearing Problem    HPI Becky Jackson is a 20 y.o. female that presents to the emergency department with hearing loss in left ear for one month and hearing loss in right ear for 1 day. Patient states that she's had an associated headache from not being able to hear. Patient also has some sinus pressure. Patient gets dizzy from moving quickly. Patient has been taking Sudafed.   Past Medical History:  Diagnosis Date  . Acid reflux   . Hypertension     Patient Active Problem List   Diagnosis Date Noted  . Anemia 11/16/2012  . Chronic cholecystitis 10/26/2012  . Abdominal pain, other specified site 10/25/2012    Past Surgical History:  Procedure Laterality Date  . CHOLECYSTECTOMY  2014    Prior to Admission medications   Medication Sig Start Date End Date Taking? Authorizing Provider  albuterol (PROVENTIL HFA;VENTOLIN HFA) 108 (90 BASE) MCG/ACT inhaler Inhale 2 puffs into the lungs every 4 (four) hours as needed for wheezing or shortness of breath. 01/08/15   Paulette Blanch, MD  atenolol (TENORMIN) 25 MG tablet Take 25 mg by mouth daily.    Historical Provider, MD  azithromycin (ZITHROMAX) 250 MG tablet Take 1 tablet (250 mg total) by mouth daily. 01/08/15   Paulette Blanch, MD  butalbital-acetaminophen-caffeine [facility]) [phone] MG tablet Take 1-2 tablets by mouth every 6 (six) hours as needed for headache. 07/16/15 07/15/16  Orbie Pyo, MD  chlorpheniramine-HYDROcodone [facility] PENNKINETIC ER) 10-8 MG/5ML SUER Take 5 mLs by mouth 2 (two) times daily. 11/04/15   Paulette Blanch, MD  fluticasone (FLONASE) 50 MCG/ACT nasal spray Place 2 sprays into both nostrils daily. 01/08/16 01/07/17  Laban Emperor, PA-C  montelukast (SINGULAIR) 10  MG tablet Take 10 mg by mouth as needed.    Historical Provider, MD  nitrofurantoin (MACRODANTIN) 100 MG capsule Take 1 capsule (100 mg total) by mouth 2 (two) times daily. 03/21/15   Carrie Mew, MD  norelgestromin-ethinyl estradiol (ORTHO EVRA) 150-35 MCG/24HR transdermal patch Place 1 patch onto the skin once a week.    Historical Provider, MD  omeprazole (PRILOSEC) 20 MG capsule Take 20 mg by mouth daily.    Historical Provider, MD  ondansetron (ZOFRAN ODT) 8 MG disintegrating tablet Take 1 tablet (8 mg total) by mouth every 8 (eight) hours as needed for nausea or vomiting. 03/21/15   Carrie Mew, MD  predniSONE (DELTASONE) 20 MG tablet 3 tablets daily 4 days 01/08/15   Paulette Blanch, MD  ranitidine (ZANTAC) 150 MG capsule Take 1 capsule (150 mg total) by mouth 2 (two) times daily. 03/21/15   Carrie Mew, MD    Allergies Augmentin [amoxicillin-pot clavulanate] and Shellfish allergy  History reviewed. No pertinent family history.  Social History Social History  Substance Use Topics  . Smoking status: Never Smoker  . Smokeless tobacco: Never Used  . Alcohol use No     Review of Systems  Constitutional: No fever/chills Eyes: No visual changes. No discharge Cardiovascular: no chest pain. Respiratory: no cough. No SOB. Gastrointestinal: No abdominal pain.  No nausea, no vomiting.  Musculoskeletal: Negative for musculoskeletal pain. Skin: Negative for rash, abrasions, lacerations, ecchymosis.  ____________________________________________   PHYSICAL EXAM:  VITAL SIGNS: ED Triage Vitals [01/08/16 1900]  Enc Vitals Group  BP (!) 143/79     Pulse Rate 90     Resp 15     Temp 97.7 F (36.5 C)     Temp Source Oral     SpO2 100 %     Weight 200 lb (90.7 kg)     Height 5\' 3"  (1.6 m)     Head Circumference      Peak Flow      Pain Score      Pain Loc      Pain Edu?      Excl. in [place]?      Constitutional: Alert and oriented. Well appearing and in no acute  distress. Eyes: Conjunctivae are normal.  Head: Atraumatic. ENT:      Ears: Cerumen obstructing tympanic membranes bilaterally.      Nose: No congestion/rhinnorhea.      Mouth/Throat: Mucous membranes are moist.  Neck: No stridor.  Hematological/Lymphatic/Immunilogical: No cervical lymphadenopathy. Cardiovascular: Normal rate, regular rhythm. Normal S1 and S2.  Good peripheral circulation. Respiratory: Normal respiratory effort without tachypnea or retractions. Lungs CTAB. Good air entry to the bases with no decreased or absent breath sounds. Musculoskeletal: Full range of motion to all extremities. No gross deformities appreciated. Neurologic:  Normal speech and language. No gross focal neurologic deficits are appreciated.  Skin:  Skin is warm, dry and intact. No rash noted. Psychiatric: Mood and affect are normal. Speech and behavior are normal. Patient exhibits appropriate insight and judgement.   ____________________________________________   LABS (all labs ordered are listed, but only abnormal results are displayed)  Labs Reviewed - No data to display ____________________________________________  EKG   ____________________________________________  RADIOLOGY   No results found.  ____________________________________________    PROCEDURES  Procedure(s) performed:    Procedures  5 drops of Debrox was applied to each ear. Ears were irrigated with water to remove cerumen. An ear currette was used to remove remaining cerumen.   Medications  carbamide peroxide (DEBROX) 6.5 % otic solution 5 drop (5 drops Both Ears Given 01/08/16 2030)  ketorolac (TORADOL) injection 30 mg (30 mg Intramuscular Given 01/08/16 2015)  ondansetron (ZOFRAN) tablet 4 mg (4 mg Oral Given 01/08/16 2128)     ____________________________________________   INITIAL IMPRESSION / ASSESSMENT AND PLAN / ED COURSE  Pertinent labs & imaging results that were available during my care of the  patient were reviewed by me and considered in my medical decision making (see chart for details).  Review of the [place] CSRS was performed in accordance of the [place] prior to dispensing any controlled drugs.  Clinical Course     Patient's diagnosis is consistent with cerumen impaction. Both ears were impacted with cerumen. Tympanic membranes were visualized and pearly gray after disimpaction. Patient's hearing improved after cerumen disimpaction. Patient was given Flonase for congestion and nasal drainage. Patient is to follow up with PCP or ENT as needed or otherwise directed. Patient is given ED precautions to return to the ED for any worsening or new symptoms.     ____________________________________________  FINAL CLINICAL IMPRESSION(S) / ED DIAGNOSES  Final diagnoses:  Bilateral impacted cerumen      NEW MEDICATIONS STARTED DURING THIS VISIT:  Discharge Medication List as of 01/08/2016  9:28 PM    START taking these medications   Details  fluticasone (FLONASE) 50 MCG/ACT nasal spray Place 2 sprays into both nostrils daily., Starting Tue 01/08/2016, Until Wed 01/07/2017, Print            This chart was dictated  using voice recognition software/Dragon. Despite best efforts to proofread, errors can occur which can change the meaning. Any change was purely unintentional.   Laban Emperor, PA-C 01/08/16 [place], MD 01/08/16 [place], PA-C 02/03/16 [place], MD 02/04/16 [phone]

## 2016-01-11 ENCOUNTER — Encounter: Payer: Self-pay | Admitting: Emergency Medicine

## 2016-01-11 ENCOUNTER — Emergency Department
Admission: EM | Admit: 2016-01-11 | Discharge: 2016-01-12 | Disposition: A | Discharge status: Home / Self Care | Payer: Medicaid Other | Attending: Emergency Medicine | Admitting: Emergency Medicine

## 2016-01-11 DIAGNOSIS — R51 Headache: Secondary | ICD-10-CM

## 2016-01-11 DIAGNOSIS — I10 Essential (primary) hypertension: Secondary | ICD-10-CM | POA: Insufficient documentation

## 2016-01-11 DIAGNOSIS — J0100 Acute maxillary sinusitis, unspecified: Secondary | ICD-10-CM

## 2016-01-11 DIAGNOSIS — Z79899 Other long term (current) drug therapy: Secondary | ICD-10-CM | POA: Diagnosis not present

## 2016-01-11 DIAGNOSIS — R519 Headache, unspecified: Secondary | ICD-10-CM

## 2016-01-11 NOTE — ED Notes (Signed)
Pt's friend to desk requesting if pt can be moved to room b/c environment is making headache worse.  Friend informed room not available but pt moved to subwait and provided warm blanket

## 2016-01-11 NOTE — ED Notes (Addendum)
Pt c/o headache, nausea, anorexia, photophobia X 1 week. Pt c/o she had diarrhea X 2 days, however reports this is normal for her

## 2016-01-11 NOTE — ED Triage Notes (Signed)
Pt comes into the ED via POV c/o headache that has been going on for 1 week.  Patient states nausea is intermittent with no vomiting.  Denies any h/o migraines.  Patient in NAD at this time with even and unlabored respirations and neurologically intact. Patient claims she is having photosensitivity.

## 2016-01-12 ENCOUNTER — Emergency Department: Payer: Medicaid Other

## 2016-01-12 MED ORDER — MORPHINE SULFATE (PF) 4 MG/ML IV SOLN
2.0000 mg | Freq: Once | INTRAVENOUS | Status: AC
  Administered 2016-01-12: 2 mg via INTRAVENOUS
  Filled 2016-01-12: qty 1

## 2016-01-12 MED ORDER — IBUPROFEN 800 MG PO TABS
ORAL_TABLET | ORAL | Status: AC
  Filled 2016-01-12: qty 1

## 2016-01-12 MED ORDER — IBUPROFEN 800 MG PO TABS: 800.0000 mg | ORAL_TABLET | Freq: Three times a day (TID) | ORAL | 0 refills | Status: DC | PRN

## 2016-01-12 MED ORDER — AZITHROMYCIN 500 MG PO TABS
500.0000 mg | ORAL_TABLET | Freq: Once | ORAL | Status: AC
Start: 2016-01-12 — End: 2016-01-12
  Administered 2016-01-12: 500 mg via ORAL
  Filled 2016-01-12: qty 1

## 2016-01-12 MED ORDER — SODIUM CHLORIDE 0.9 % IV BOLUS (SEPSIS)
1000.0000 mL | Freq: Once | INTRAVENOUS | Status: AC
  Administered 2016-01-12: 1000 mL via INTRAVENOUS

## 2016-01-12 MED ORDER — IBUPROFEN 800 MG PO TABS
800.0000 mg | ORAL_TABLET | Freq: Once | ORAL | Status: AC
  Administered 2016-01-12: 800 mg via ORAL

## 2016-01-12 MED ORDER — ONDANSETRON HCL 4 MG/2ML IJ SOLN
4.0000 mg | Freq: Once | INTRAMUSCULAR | Status: AC
  Administered 2016-01-12: 4 mg via INTRAVENOUS
  Filled 2016-01-12: qty 2

## 2016-01-12 MED ORDER — AZITHROMYCIN 500 MG PO TABS: 500.0000 mg | ORAL_TABLET | Freq: Every day | ORAL | 0 refills | Status: AC

## 2016-01-12 NOTE — ED Provider Notes (Signed)
[facility] Emergency Department Provider Note   Time seen: 11:45 PM  I have reviewed the triage vital signs and the nursing notes.   HISTORY  Chief Complaint Headache    HPI Becky Jackson is a 20 y.o. female presents with one-week history of left frontal headache nausea congestion and photosensitivity. Patient also admits to decreased hearing from the left ear. Patient was seen in the emergency department on 01/08/2016 diagnosis cerumen impaction. Patient states however headache has persisted as well as decreased hearing describe the fact that her ears were cleaned while in the ED. Patient denies any weakness numbness gait instability or visual changes.   Past Medical History:  Diagnosis Date  . Acid reflux   . Hypertension     Patient Active Problem List   Diagnosis Date Noted  . Anemia 11/16/2012  . Chronic cholecystitis 10/26/2012  . Abdominal pain, other specified site 10/25/2012    Past Surgical History:  Procedure Laterality Date  . CHOLECYSTECTOMY  2014    Prior to Admission medications   Medication Sig Start Date End Date Taking? Authorizing Provider  albuterol (PROVENTIL HFA;VENTOLIN HFA) 108 (90 BASE) MCG/ACT inhaler Inhale 2 puffs into the lungs every 4 (four) hours as needed for wheezing or shortness of breath. 01/08/15   Paulette Blanch, MD  atenolol (TENORMIN) 25 MG tablet Take 25 mg by mouth daily.    Historical Provider, MD  azithromycin (ZITHROMAX) 250 MG tablet Take 1 tablet (250 mg total) by mouth daily. 01/08/15   Paulette Blanch, MD  butalbital-acetaminophen-caffeine [facility]) [phone] MG tablet Take 1-2 tablets by mouth every 6 (six) hours as needed for headache. 07/16/15 07/15/16  Orbie Pyo, MD  chlorpheniramine-HYDROcodone [facility] PENNKINETIC ER) 10-8 MG/5ML SUER Take 5 mLs by mouth 2 (two) times daily. 11/04/15   Paulette Blanch, MD  fluticasone (FLONASE) 50 MCG/ACT nasal spray Place 2 sprays into both nostrils  daily. 01/08/16 01/07/17  Laban Emperor, PA-C  montelukast (SINGULAIR) 10 MG tablet Take 10 mg by mouth as needed.    Historical Provider, MD  nitrofurantoin (MACRODANTIN) 100 MG capsule Take 1 capsule (100 mg total) by mouth 2 (two) times daily. 03/21/15   Carrie Mew, MD  norelgestromin-ethinyl estradiol (ORTHO EVRA) 150-35 MCG/24HR transdermal patch Place 1 patch onto the skin once a week.    Historical Provider, MD  omeprazole (PRILOSEC) 20 MG capsule Take 20 mg by mouth daily.    Historical Provider, MD  ondansetron (ZOFRAN ODT) 8 MG disintegrating tablet Take 1 tablet (8 mg total) by mouth every 8 (eight) hours as needed for nausea or vomiting. 03/21/15   Carrie Mew, MD  predniSONE (DELTASONE) 20 MG tablet 3 tablets daily 4 days 01/08/15   Paulette Blanch, MD  ranitidine (ZANTAC) 150 MG capsule Take 1 capsule (150 mg total) by mouth 2 (two) times daily. 03/21/15   Carrie Mew, MD    Allergies Augmentin [amoxicillin-pot clavulanate] and Shellfish allergy  No family history on file.  Social History Social History  Substance Use Topics  . Smoking status: Never Smoker  . Smokeless tobacco: Never Used  . Alcohol use No    Review of Systems Constitutional: No fever/chills Eyes: No visual changes. ENT: No sore throat. Cardiovascular: Denies chest pain. Respiratory: Denies shortness of breath. Gastrointestinal: No abdominal pain.  No nausea, no vomiting.  No diarrhea.  No constipation. Genitourinary: Negative for dysuria. Musculoskeletal: Negative for back pain. Skin: Negative for rash. Neurological: Positive for headache negative for any  focal weakness or numbness.  10-point ROS otherwise negative.  ____________________________________________   PHYSICAL EXAM:  VITAL SIGNS: ED Triage Vitals  Enc Vitals Group     BP 01/11/16 2112 (!) 144/83     Pulse Rate 01/11/16 2112 98     Resp 01/11/16 2112 18     Temp 01/11/16 2112 98.1 F (36.7 C)     Temp Source  01/11/16 2112 Oral     SpO2 01/11/16 2112 100 %     Weight 01/11/16 2113 200 lb (90.7 kg)     Height 01/11/16 2113 5\' 3"  (1.6 m)     Head Circumference --      Peak Flow --      Pain Score 01/11/16 2113 9     Pain Loc --      Pain Edu? --      Excl. in [place]? --     Constitutional: Alert and oriented. Well appearing and in no acute distress. Eyes: Conjunctivae are normal. PERRL. EOMI. Head: Atraumatic.Left maxillary sinus pain with palpation. Ears:  Healthy appearing ear canals and clear noted bilateral TM worse on the left Nose: No congestion/rhinnorhea. Mouth/Throat: Mucous membranes are moist.  Oropharynx non-erythematous. Neck: No stridor.   Cardiovascular: Normal rate, regular rhythm. Good peripheral circulation. Grossly normal heart sounds. Respiratory: Normal respiratory effort.  No retractions. Lungs CTAB. Gastrointestinal: Soft and nontender. No distention.   Musculoskeletal: No lower extremity tenderness nor edema. No gross deformities of extremities. Neurologic:  Normal speech and language. No gross focal neurologic deficits are appreciated.  Skin:  Skin is warm, dry and intact. No rash noted. Psychiatric: Mood and affect are normal. Speech and behavior are normal.  _  RADIOLOGY I, [place], personally viewed and evaluated these images (plain radiographs) as part of my medical decision making, as well as reviewing the written report by the radiologist.  Ct Head Wo Contrast  Result Date: 01/12/2016 CLINICAL DATA:  20 year old female with headache, nausea and posterior right thigh pain. EXAM: CT HEAD WITHOUT CONTRAST TECHNIQUE: Contiguous axial images were obtained from the base of the skull through the vertex without intravenous contrast. COMPARISON:  Head CT dated 07/16/2015 FINDINGS: Brain: No evidence of acute infarction, hemorrhage, hydrocephalus, extra-axial collection or mass lesion/mass effect. Vascular: No hyperdense vessel or unexpected calcification. Skull:  Normal. Negative for fracture or focal lesion. Sinuses/Orbits: There is diffuse mucoperiosteal and partial opacification thickening of paranasal sinuses. The visualized mastoid air cells are clear. The visualized globes are intact. Other: None IMPRESSION: No acute intracranial pathology. Paranasal sinus disease. Electronically Signed   By: Anner Crete M.D.   On: 01/12/2016 00:48    ____________________________________________    Procedures     INITIAL IMPRESSION / ASSESSMENT AND PLAN / ED COURSE  Pertinent labs & imaging results that were available during my care of the patient were reviewed by me and considered in my medical decision making (see chart for details).  Patient given IV morphine 2 mg and Zofran with improvement of symptoms. CT scan consistent with sinusitis as such suspect etiology of the patient's headache to be secondary to a sinus headache. Patient will be prescribed azithromycin and ibuprofen for home.    Clinical Course     ____________________________________________  FINAL CLINICAL IMPRESSION(S) / ED DIAGNOSES  Final diagnoses:  Sinus headache  Acute non-recurrent maxillary sinusitis     MEDICATIONS GIVEN DURING THIS VISIT:  Medications  azithromycin (ZITHROMAX) tablet 500 mg (not administered)  ondansetron (ZOFRAN) injection 4 mg (4 mg Intravenous  Given 01/12/16 0056)  morphine 4 MG/ML injection 2 mg (2 mg Intravenous Given 01/12/16 0056)  sodium chloride 0.9 % bolus 1,000 mL (1,000 mLs Intravenous New Bag/Given 01/12/16 0108)     NEW OUTPATIENT MEDICATIONS STARTED DURING THIS VISIT:  New Prescriptions   No medications on file    Modified Medications   No medications on file    Discontinued Medications   No medications on file     Note:  This document was prepared using Dragon voice recognition software and may include unintentional dictation errors.    Gregor Hams, MD 01/12/16 [phone]

## 2016-01-12 NOTE — ED Notes (Signed)
Discharge instructions reviewed with patient. Questions fielded by this RN. Patient verbalizes understanding of instructions. Patient discharged home in stable condition per [place] MD . No acute distress noted at time of discharge.   

## 2016-04-28 ENCOUNTER — Emergency Department
Admission: EM | Admit: 2016-04-28 | Discharge: 2016-04-28 | Disposition: A | Discharge status: Home / Self Care | Payer: Medicaid Other | Attending: Emergency Medicine | Admitting: Emergency Medicine

## 2016-04-28 ENCOUNTER — Encounter: Payer: Self-pay | Admitting: *Deleted

## 2016-04-28 ENCOUNTER — Emergency Department: Payer: Medicaid Other

## 2016-04-28 DIAGNOSIS — I10 Essential (primary) hypertension: Secondary | ICD-10-CM | POA: Insufficient documentation

## 2016-04-28 DIAGNOSIS — J45909 Unspecified asthma, uncomplicated: Secondary | ICD-10-CM | POA: Diagnosis not present

## 2016-04-28 DIAGNOSIS — K529 Noninfective gastroenteritis and colitis, unspecified: Secondary | ICD-10-CM | POA: Diagnosis not present

## 2016-04-28 DIAGNOSIS — R1030 Lower abdominal pain, unspecified: Secondary | ICD-10-CM | POA: Diagnosis not present

## 2016-04-28 DIAGNOSIS — Z79899 Other long term (current) drug therapy: Secondary | ICD-10-CM | POA: Diagnosis not present

## 2016-04-28 DIAGNOSIS — R197 Diarrhea, unspecified: Secondary | ICD-10-CM | POA: Diagnosis present

## 2016-04-28 HISTORY — DX: Unspecified asthma, uncomplicated: J45.909

## 2016-04-28 LAB — COMPREHENSIVE METABOLIC PANEL
ALT: 15 U/L (ref 14–54)
ANION GAP: 10 (ref 5–15)
AST: 16 U/L (ref 15–41)
Albumin: 5 g/dL (ref 3.5–5.0)
Alkaline Phosphatase: 93 U/L (ref 38–126)
BUN: 17 mg/dL (ref 6–20)
CALCIUM: 9.3 mg/dL (ref 8.9–10.3)
CO2: 20 mmol/L — AB (ref 22–32)
Chloride: 107 mmol/L (ref 101–111)
Creatinine, Ser: 0.8 mg/dL (ref 0.44–1.00)
GFR calc non Af Amer: >60 mL/min (ref >60)
GLUCOSE: 100 mg/dL — AB (ref 65–99)
POTASSIUM: 4 mmol/L (ref 3.5–5.1)
Sodium: 137 mmol/L (ref 135–145)
TOTAL PROTEIN: 8.4 g/dL — AB (ref 6.5–8.1)
Total Bilirubin: 0.7 mg/dL (ref 0.3–1.2)

## 2016-04-28 LAB — URINALYSIS, COMPLETE (UACMP) WITH MICROSCOPIC
BILIRUBIN URINE: NEGATIVE
GLUCOSE, UA: NEGATIVE mg/dL
HGB URINE DIPSTICK: NEGATIVE
Ketones, ur: 20 mg/dL — AB
NITRITE: NEGATIVE
PH: 5 (ref 5.0–8.0)
PROTEIN: NEGATIVE mg/dL
RBC / HPF: NONE SEEN RBC/hpf (ref 0–5)
SPECIFIC GRAVITY, URINE: 1.029 (ref 1.005–1.030)

## 2016-04-28 LAB — CBC
HEMATOCRIT: 48 % — AB (ref 35.0–47.0)
HEMOGLOBIN: 16.2 g/dL — AB (ref 12.0–16.0)
MCH: 30.1 pg (ref 26.0–34.0)
MCHC: 33.7 g/dL (ref 32.0–36.0)
MCV: 89.5 fL (ref 80.0–100.0)
PLATELETS: 278 10*3/uL (ref 150–440)
RBC: 5.36 MIL/uL — AB (ref 3.80–5.20)
RDW: 12.7 % (ref 11.5–14.5)
WBC: 18.3 10*3/uL — ABNORMAL HIGH (ref 3.6–11.0)

## 2016-04-28 LAB — LIPASE, BLOOD: LIPASE: 12 U/L (ref 11–51)

## 2016-04-28 LAB — POCT PREGNANCY, URINE: PREG TEST UR: NEGATIVE

## 2016-04-28 MED ORDER — IOPAMIDOL (ISOVUE-300) INJECTION 61%
30.0000 mL | Freq: Once | INTRAVENOUS | Status: AC | PRN
  Administered 2016-04-28: 30 mL via ORAL
  Filled 2016-04-28: qty 30

## 2016-04-28 MED ORDER — ONDANSETRON 4 MG PO TBDP: 4.0000 mg | ORAL_TABLET | Freq: Three times a day (TID) | ORAL | 0 refills | Status: DC | PRN

## 2016-04-28 MED ORDER — SODIUM CHLORIDE 0.9 % IV SOLN
1000.0000 mL | Freq: Once | INTRAVENOUS | Status: AC
  Administered 2016-04-28: 1000 mL via INTRAVENOUS

## 2016-04-28 MED ORDER — ONDANSETRON HCL 4 MG/2ML IJ SOLN
4.0000 mg | Freq: Once | INTRAMUSCULAR | Status: AC
  Administered 2016-04-28: 4 mg via INTRAVENOUS
  Filled 2016-04-28: qty 2

## 2016-04-28 MED ORDER — IOPAMIDOL (ISOVUE-300) INJECTION 61%
100.0000 mL | Freq: Once | INTRAVENOUS | Status: AC | PRN
  Administered 2016-04-28: 100 mL via INTRAVENOUS
  Filled 2016-04-28: qty 100

## 2016-04-28 NOTE — ED Notes (Signed)
Pt to ct 

## 2016-04-28 NOTE — ED Provider Notes (Signed)
[facility] Emergency Department Provider Note   ____________________________________________    I have reviewed the triage vital signs and the nursing notes.   HISTORY  Chief Complaint Emesis and Diarrhea     HPI Becky Jackson is a 21 y.o. female presents with complaints of nausea vomiting diarrhea abdominal pain. Patient describes abdominal pain around her umbilicus which is moderate to severe at times. She has had nausea and vomiting which started this morning. No sick contacts reported. Does report chills. No recent travel. No history of abdominal surgery. No dysuria   Past Medical History:  Diagnosis Date  . Acid reflux   . Asthma   . Hypertension     Patient Active Problem List   Diagnosis Date Noted  . Anemia 11/16/2012  . Chronic cholecystitis 10/26/2012  . Abdominal pain, other specified site 10/25/2012    Past Surgical History:  Procedure Laterality Date  . CHOLECYSTECTOMY  2014    Prior to Admission medications   Medication Sig Start Date End Date Taking? Authorizing Provider  albuterol (PROVENTIL HFA;VENTOLIN HFA) 108 (90 BASE) MCG/ACT inhaler Inhale 2 puffs into the lungs every 4 (four) hours as needed for wheezing or shortness of breath. 01/08/15   Paulette Blanch, MD  atenolol (TENORMIN) 25 MG tablet Take 25 mg by mouth daily.    Historical Provider, MD  azithromycin (ZITHROMAX) 250 MG tablet Take 1 tablet (250 mg total) by mouth daily. 01/08/15   Paulette Blanch, MD  butalbital-acetaminophen-caffeine [facility]) [phone] MG tablet Take 1-2 tablets by mouth every 6 (six) hours as needed for headache. 07/16/15 07/15/16  Orbie Pyo, MD  chlorpheniramine-HYDROcodone [facility] PENNKINETIC ER) 10-8 MG/5ML SUER Take 5 mLs by mouth 2 (two) times daily. 11/04/15   Paulette Blanch, MD  fluticasone (FLONASE) 50 MCG/ACT nasal spray Place 2 sprays into both nostrils daily. 01/08/16 01/07/17  Laban Emperor, PA-C  ibuprofen (ADVIL,MOTRIN)  800 MG tablet Take 1 tablet (800 mg total) by mouth every 8 (eight) hours as needed. 01/12/16   Gregor Hams, MD  montelukast (SINGULAIR) 10 MG tablet Take 10 mg by mouth as needed.    Historical Provider, MD  nitrofurantoin (MACRODANTIN) 100 MG capsule Take 1 capsule (100 mg total) by mouth 2 (two) times daily. 03/21/15   Carrie Mew, MD  norelgestromin-ethinyl estradiol (ORTHO EVRA) 150-35 MCG/24HR transdermal patch Place 1 patch onto the skin once a week.    Historical Provider, MD  omeprazole (PRILOSEC) 20 MG capsule Take 20 mg by mouth daily.    Historical Provider, MD  ondansetron (ZOFRAN ODT) 4 MG disintegrating tablet Take 1 tablet (4 mg total) by mouth every 8 (eight) hours as needed for nausea or vomiting. 04/28/16   Lavonia Drafts, MD  predniSONE (DELTASONE) 20 MG tablet 3 tablets daily 4 days 01/08/15   Paulette Blanch, MD  ranitidine (ZANTAC) 150 MG capsule Take 1 capsule (150 mg total) by mouth 2 (two) times daily. 03/21/15   Carrie Mew, MD     Allergies Augmentin [amoxicillin-pot clavulanate] and Shellfish allergy  History reviewed. No pertinent family history.  Social History Social History  Substance Use Topics  . Smoking status: Never Smoker  . Smokeless tobacco: Never Used  . Alcohol use No    Review of Systems  Constitutional: Positive chills  ENT: No sore throat. Cardiovascular: Denies chest pain. Respiratory: Denies Cough Gastrointestinal: As above Genitourinary: Negative for dysuria. Musculoskeletal: Negative for back pain. Skin: Negative for rash. Neurological: Negative for headaches  or weakness  10-point ROS otherwise negative.  ____________________________________________   PHYSICAL EXAM:  VITAL SIGNS: ED Triage Vitals [04/28/16 1530]  Enc Vitals Group     BP 130/85     Pulse Rate 97     Resp 18     Temp 98.2 F (36.8 C)     Temp Source Oral     SpO2 100 %     Weight 190 lb (86.2 kg)     Height 5\' 3"  (1.6 m)     Head  Circumference      Peak Flow      Pain Score 10     Pain Loc      Pain Edu?      Excl. in [place]?     Constitutional: Alert and oriented. No acute distress. Pleasant and interactive Eyes: Conjunctivae are normal.   Nose: No congestion/rhinnorhea. Mouth/Throat: Mucous membranes are moist.   Neck:  Painless ROM Cardiovascular: Normal rate, regular rhythm. Grossly normal heart sounds.  Good peripheral circulation. Respiratory: Normal respiratory effort.  No retractions. Lungs CTAB. Gastrointestinal: Mild tenderness to palpation right lower quadrant. No distention.  No CVA tenderness. Genitourinary: deferred Musculoskeletal:  Warm and well perfused Neurologic:  Normal speech and language. No gross focal neurologic deficits are appreciated.  Skin:  Skin is warm, dry and intact. No rash noted. Psychiatric: Mood and affect are normal. Speech and behavior are normal.  ____________________________________________   LABS (all labs ordered are listed, but only abnormal results are displayed)  Labs Reviewed  COMPREHENSIVE METABOLIC PANEL - Abnormal; Notable for the following:       Result Value   CO2 20 (*)    Glucose, Bld 100 (*)    Total Protein 8.4 (*)    All other components within normal limits  CBC - Abnormal; Notable for the following:    WBC 18.3 (*)    RBC 5.36 (*)    Hemoglobin 16.2 (*)    HCT 48.0 (*)    All other components within normal limits  URINALYSIS, COMPLETE (UACMP) WITH MICROSCOPIC - Abnormal; Notable for the following:    Color, Urine YELLOW (*)    APPearance HAZY (*)    Ketones, ur 20 (*)    Leukocytes, UA TRACE (*)    Bacteria, UA RARE (*)    Squamous Epithelial / LPF 6-30 (*)    All other components within normal limits  LIPASE, BLOOD  POCT PREGNANCY, URINE   ____________________________________________  EKG  None ____________________________________________  RADIOLOGY  CT abdomen pelvis  unremarkable ____________________________________________   PROCEDURES  Procedure(s) performed: No    Critical Care performed: No ____________________________________________   INITIAL IMPRESSION / ASSESSMENT AND PLAN / ED COURSE  Pertinent labs & imaging results that were available during my care of the patient were reviewed by me and considered in my medical decision making (see chart for details).  Patient presents with nausea vomiting and diarrhea with abdominal pain. Lab work is overall reassuring however she has a significantly elevated white blood cell count. Given that she has mild tenderness in the right lower quadrant I will obtain CT. I suspect likely viral gastroenteritis as the cause of her symptoms  CT reassuring. Patient feels improved after fluids and Zofran. We will discharge with by mouth Zofran. Return precautions discussed    ____________________________________________   FINAL CLINICAL IMPRESSION(S) / ED DIAGNOSES  Final diagnoses:  Gastroenteritis      NEW MEDICATIONS STARTED DURING THIS VISIT:  New Prescriptions   ONDANSETRON (ZOFRAN ODT)  4 MG DISINTEGRATING TABLET    Take 1 tablet (4 mg total) by mouth every 8 (eight) hours as needed for nausea or vomiting.     Note:  This document was prepared using Dragon voice recognition software and may include unintentional dictation errors.    Lavonia Drafts, MD 04/28/16 [phone]

## 2016-04-28 NOTE — ED Triage Notes (Signed)
States vomiting and diarrhea with abd cramping that began this AM, awake and alert In no acute distress

## 2016-04-28 NOTE — ED Notes (Signed)
Pt halfway thru 2nd bottle of contrast; states she is unable to drink any more.

## 2016-05-02 ENCOUNTER — Emergency Department
Admission: EM | Admit: 2016-05-02 | Discharge: 2016-05-02 | Disposition: A | Discharge status: Home / Self Care | Payer: Medicaid Other | Attending: Emergency Medicine | Admitting: Emergency Medicine

## 2016-05-02 ENCOUNTER — Encounter: Payer: Self-pay | Admitting: Emergency Medicine

## 2016-05-02 DIAGNOSIS — R112 Nausea with vomiting, unspecified: Secondary | ICD-10-CM | POA: Diagnosis present

## 2016-05-02 DIAGNOSIS — R1084 Generalized abdominal pain: Secondary | ICD-10-CM | POA: Diagnosis not present

## 2016-05-02 DIAGNOSIS — J45909 Unspecified asthma, uncomplicated: Secondary | ICD-10-CM | POA: Insufficient documentation

## 2016-05-02 DIAGNOSIS — I10 Essential (primary) hypertension: Secondary | ICD-10-CM | POA: Diagnosis not present

## 2016-05-02 DIAGNOSIS — Z79899 Other long term (current) drug therapy: Secondary | ICD-10-CM | POA: Insufficient documentation

## 2016-05-02 LAB — COMPREHENSIVE METABOLIC PANEL
ALBUMIN: 4.2 g/dL (ref 3.5–5.0)
ALK PHOS: 79 U/L (ref 38–126)
ALT: 13 U/L — AB (ref 14–54)
ANION GAP: 9 (ref 5–15)
AST: 15 U/L (ref 15–41)
BILIRUBIN TOTAL: 0.6 mg/dL (ref 0.3–1.2)
BUN: 14 mg/dL (ref 6–20)
CALCIUM: 8.5 mg/dL — AB (ref 8.9–10.3)
CO2: 22 mmol/L (ref 22–32)
Chloride: 109 mmol/L (ref 101–111)
Creatinine, Ser: 0.8 mg/dL (ref 0.44–1.00)
GFR calc Af Amer: >60 mL/min (ref >60)
GFR calc non Af Amer: >60 mL/min (ref >60)
GLUCOSE: 104 mg/dL — AB (ref 65–99)
Potassium: 3.3 mmol/L — ABNORMAL LOW (ref 3.5–5.1)
SODIUM: 140 mmol/L (ref 135–145)
TOTAL PROTEIN: 7.2 g/dL (ref 6.5–8.1)

## 2016-05-02 LAB — LIPASE, BLOOD

## 2016-05-02 LAB — CBC
HEMATOCRIT: 42 % (ref 35.0–47.0)
Hemoglobin: 14.3 g/dL (ref 12.0–16.0)
MCH: 30.6 pg (ref 26.0–34.0)
MCHC: 34 g/dL (ref 32.0–36.0)
MCV: 90.1 fL (ref 80.0–100.0)
Platelets: 228 10*3/uL (ref 150–440)
RBC: 4.66 MIL/uL (ref 3.80–5.20)
RDW: 12.9 % (ref 11.5–14.5)
WBC: 11.7 10*3/uL — AB (ref 3.6–11.0)

## 2016-05-02 MED ORDER — ONDANSETRON HCL 4 MG/2ML IJ SOLN
4.0000 mg | Freq: Once | INTRAMUSCULAR | Status: AC
  Administered 2016-05-02: 4 mg via INTRAVENOUS
  Filled 2016-05-02: qty 2

## 2016-05-02 MED ORDER — PROMETHAZINE HCL 25 MG/ML IJ SOLN
12.5000 mg | Freq: Once | INTRAMUSCULAR | Status: AC
  Administered 2016-05-02: 12.5 mg via INTRAVENOUS
  Filled 2016-05-02: qty 1

## 2016-05-02 MED ORDER — DICYCLOMINE HCL 20 MG PO TABS: 20.0000 mg | ORAL_TABLET | Freq: Three times a day (TID) | ORAL | 0 refills | Status: DC | PRN

## 2016-05-02 MED ORDER — DICYCLOMINE HCL 10 MG PO CAPS
ORAL_CAPSULE | ORAL | Status: AC
  Administered 2016-05-02: 10 mg via ORAL
  Filled 2016-05-02: qty 1

## 2016-05-02 MED ORDER — DICYCLOMINE HCL 10 MG PO CAPS
10.0000 mg | ORAL_CAPSULE | Freq: Once | ORAL | Status: AC
  Administered 2016-05-02: 10 mg via ORAL

## 2016-05-02 MED ORDER — SODIUM CHLORIDE 0.9 % IV SOLN
1000.0000 mL | Freq: Once | INTRAVENOUS | Status: AC
  Administered 2016-05-02: 1000 mL via INTRAVENOUS

## 2016-05-02 MED ORDER — GI COCKTAIL ~~LOC~~
30.0000 mL | Freq: Once | ORAL | Status: AC
  Administered 2016-05-02: 30 mL via ORAL
  Filled 2016-05-02: qty 30

## 2016-05-02 NOTE — ED Notes (Signed)
Vitals taken, IV removed from left arm. Pt is dressed and waiting to be discharged.

## 2016-05-02 NOTE — ED Provider Notes (Signed)
[facility] Emergency Department Provider Note   ____________________________________________    I have reviewed the triage vital signs and the nursing notes.   HISTORY  Chief Complaint Emesis     HPI Becky Jackson is a 21 y.o. female who presents with complaints of nausea and vomiting and abdominal pain. Patient seen by me 4 days ago for similar complaints. She reports her diarrhea is improved somewhat but she continues to have nausea and vomiting. She reports throughout the day she typically starts to feel better tries to get something to eat around 5 PM and then always wakes up in the night with nausea and vomiting and epigastric discomfort. Today she also presents with her infant daughter who has developed nausea and vomiting as well.   Past Medical History:  Diagnosis Date  . Acid reflux   . Asthma   . Hypertension     Patient Active Problem List   Diagnosis Date Noted  . Anemia 11/16/2012  . Chronic cholecystitis 10/26/2012  . Abdominal pain, other specified site 10/25/2012    Past Surgical History:  Procedure Laterality Date  . CHOLECYSTECTOMY  2014    Prior to Admission medications   Medication Sig Start Date End Date Taking? Authorizing Provider  albuterol (PROVENTIL HFA;VENTOLIN HFA) 108 (90 BASE) MCG/ACT inhaler Inhale 2 puffs into the lungs every 4 (four) hours as needed for wheezing or shortness of breath. 01/08/15   Paulette Blanch, MD  atenolol (TENORMIN) 25 MG tablet Take 25 mg by mouth daily.    Historical Provider, MD  azithromycin (ZITHROMAX) 250 MG tablet Take 1 tablet (250 mg total) by mouth daily. 01/08/15   Paulette Blanch, MD  butalbital-acetaminophen-caffeine [facility]) [phone] MG tablet Take 1-2 tablets by mouth every 6 (six) hours as needed for headache. 07/16/15 07/15/16  Orbie Pyo, MD  chlorpheniramine-HYDROcodone [facility] PENNKINETIC ER) 10-8 MG/5ML SUER Take 5 mLs by mouth 2 (two) times daily. 11/04/15    Paulette Blanch, MD  dicyclomine (BENTYL) 20 MG tablet Take 1 tablet (20 mg total) by mouth 3 (three) times daily as needed for spasms. 05/02/16 05/02/17  Lavonia Drafts, MD  fluticasone (FLONASE) 50 MCG/ACT nasal spray Place 2 sprays into both nostrils daily. 01/08/16 01/07/17  Laban Emperor, PA-C  ibuprofen (ADVIL,MOTRIN) 800 MG tablet Take 1 tablet (800 mg total) by mouth every 8 (eight) hours as needed. 01/12/16   Gregor Hams, MD  montelukast (SINGULAIR) 10 MG tablet Take 10 mg by mouth as needed.    Historical Provider, MD  nitrofurantoin (MACRODANTIN) 100 MG capsule Take 1 capsule (100 mg total) by mouth 2 (two) times daily. 03/21/15   Carrie Mew, MD  norelgestromin-ethinyl estradiol (ORTHO EVRA) 150-35 MCG/24HR transdermal patch Place 1 patch onto the skin once a week.    Historical Provider, MD  omeprazole (PRILOSEC) 20 MG capsule Take 20 mg by mouth daily.    Historical Provider, MD  ondansetron (ZOFRAN ODT) 4 MG disintegrating tablet Take 1 tablet (4 mg total) by mouth every 8 (eight) hours as needed for nausea or vomiting. 04/28/16   Lavonia Drafts, MD  predniSONE (DELTASONE) 20 MG tablet 3 tablets daily 4 days 01/08/15   Paulette Blanch, MD  ranitidine (ZANTAC) 150 MG capsule Take 1 capsule (150 mg total) by mouth 2 (two) times daily. 03/21/15   Carrie Mew, MD     Allergies Augmentin [amoxicillin-pot clavulanate] and Shellfish allergy  No family history on file.  Social History Social History  Substance Use Topics  . Smoking status: Never Smoker  . Smokeless tobacco: Never Used  . Alcohol use No    Review of Systems  Constitutional: No fever/chills Eyes: No visual changes.  ENT: No sore throat. Cardiovascular: Denies chest pain. Respiratory: Denies shortness of breath. Gastrointestinal: As above  Genitourinary: Negative for dysuria. Musculoskeletal: Negative for back pain. Skin: Negative for rash. Neurological: Negative for headaches   10-point ROS otherwise  negative.  ____________________________________________   PHYSICAL EXAM:  VITAL SIGNS: ED Triage Vitals  Enc Vitals Group     BP 05/02/16 0700 129/82     Pulse Rate 05/02/16 0700 (!) 109     Resp 05/02/16 0700 18     Temp 05/02/16 0700 98 F (36.7 C)     Temp Source 05/02/16 0700 Oral     SpO2 05/02/16 0700 95 %     Weight 05/02/16 0659 190 lb (86.2 kg)     Height 05/02/16 0659 5\' 3"  (1.6 m)     Head Circumference --      Peak Flow --      Pain Score 05/02/16 0659 10     Pain Loc --      Pain Edu? --      Excl. in [place]? --    Constitutional: Alert and oriented. No acute distress. Eyes: Conjunctivae are normal.   Nose: No congestion/rhinnorhea. Mouth/Throat: Mucous membranes are moist.    Cardiovascular: Tachycardia, regular rhythm.   Good peripheral circulation. Respiratory: Normal respiratory effort.  No retractions. Gastrointestinal: No right upper quadrant tenderness to palpation, mild tenderness over the epigastrium and left upper quadrant Genitourinary: deferred Musculoskeletal:  Warm and well perfused Neurologic:  Normal speech and language. No gross focal neurologic deficits are appreciated.  Skin:  Skin is warm, dry and intact. No rash noted. Psychiatric: Mood and affect are normal. Speech and behavior are normal.  ____________________________________________   LABS (all labs ordered are listed, but only abnormal results are displayed)  Labs Reviewed  CBC - Abnormal; Notable for the following:       Result Value   WBC 11.7 (*)    All other components within normal limits  COMPREHENSIVE METABOLIC PANEL - Abnormal; Notable for the following:    Potassium 3.3 (*)    Glucose, Bld 104 (*)    Calcium 8.5 (*)    ALT 13 (*)    All other components within normal limits  LIPASE, BLOOD - Abnormal; Notable for the following:    Lipase <10 (*)    All other components within normal limits    ____________________________________________  EKG  None ____________________________________________  RADIOLOGY  CT abdomen pelvis performed several days ago unremarkable ____________________________________________   PROCEDURES  Procedure(s) performed: No    Critical Care performed: No ____________________________________________   INITIAL IMPRESSION / ASSESSMENT AND PLAN / ED COURSE  Pertinent labs & imaging results that were available during my care of the patient were reviewed by me and considered in my medical decision making (see chart for details).  Patient presents with continued nausea vomiting and epigastric pain. However she describes daily improvement which then worsens when she tries to eat dinner with primary presentation of nausea vomiting and epigastric pain this could be continued gastritis or possible development of PUD. Given that daughter has developed nausea and vomiting still feel that viral etiology is most likely. We will treat with IV fluids and nausea medication and recheck labs.   ----------------------------------------- 12:26 PM on 05/02/2016 -----------------------------------------  Patient continues to have  nausea and upper abdominal cramping. We will try GI cocktail. Once again offered admission but she is hesitant because she doesn't know who will take care of her daughter. Labwork is improved from prior  Patient was given Bentyl and had significant improvement. She would like to go home with a prescription for Bentyl. Again recommended admission but she declined as she does not have child care. She knows she can return at any time    ____________________________________________   FINAL CLINICAL IMPRESSION(S) / ED DIAGNOSES  Final diagnoses:  Generalized abdominal pain      NEW MEDICATIONS STARTED DURING THIS VISIT:  Discharge Medication List as of 05/02/2016  1:17 PM    START taking these medications   Details  dicyclomine  (BENTYL) 20 MG tablet Take 1 tablet (20 mg total) by mouth 3 (three) times daily as needed for spasms., Starting Fri 05/02/2016, Until Sat 05/02/2017, Print         Note:  This document was prepared using Dragon voice recognition software and may include unintentional dictation errors.    Lavonia Drafts, MD 05/02/16 [phone]

## 2016-05-02 NOTE — ED Triage Notes (Addendum)
Patient ambulatory to triage with steady gait, without difficulty or distress noted; pt reports N/V/D since Monday;seen here Monday and rx zofran without relief

## 2016-05-05 ENCOUNTER — Encounter: Payer: Self-pay | Admitting: Obstetrics & Gynecology

## 2016-05-05 ENCOUNTER — Ambulatory Visit (INDEPENDENT_AMBULATORY_CARE_PROVIDER_SITE_OTHER): Payer: Self-pay | Admitting: Obstetrics & Gynecology

## 2016-05-05 VITALS — BP 130/80 | HR 87 | Ht 63.0 in | Wt 194.0 lb

## 2016-05-05 DIAGNOSIS — N760 Acute vaginitis: Secondary | ICD-10-CM

## 2016-05-05 DIAGNOSIS — B3731 Acute candidiasis of vulva and vagina: Secondary | ICD-10-CM

## 2016-05-05 DIAGNOSIS — B373 Candidiasis of vulva and vagina: Secondary | ICD-10-CM

## 2016-05-05 MED ORDER — PROMETHAZINE HCL 25 MG PO TABS: 25.0000 mg | ORAL_TABLET | Freq: Four times a day (QID) | ORAL | 2 refills | Status: DC | PRN

## 2016-05-05 MED ORDER — TERCONAZOLE 0.4 % VA CREA: 1.0000 | TOPICAL_CREAM | Freq: Every day | VAGINAL | 1 refills | Status: DC

## 2016-05-05 NOTE — Progress Notes (Signed)
  HPI:      Ms. Becky Jackson is a 21 y.o. G1P0101 who is not currently having periods (Reason: IUD)., presents today for a problem visit.  She complains of:  Vaginitis: Patient complains of an abnormal vaginal discharge for 1 week. Vaginal symptoms include discharge described as white.Vulvar symptoms include odor and vulvar itching.STI Risk: Possible STD exposure. Discharge described as: copious, white and malodorous.Other associated symptoms: none.Menstrual pattern: She had been bleeding infrequently. Contraception: IUD  PMHx: She  has a past medical history of Acid reflux; Asthma; and Hypertension. Also,  has a past surgical history that includes Cholecystectomy (2014)., family history is not on file.,  reports that she has never smoked. She has never used smokeless tobacco. She reports that she does not drink alcohol or use drugs.  She has a current medication list which includes the following prescription(s): albuterol, atenolol, dicyclomine, levonorgestrel, omeprazole, ranitidine, azithromycin, butalbital-acetaminophen-caffeine, chlorpheniramine-hydrocodone, fluticasone, ibuprofen, montelukast, nitrofurantoin, prednisone, promethazine, and terconazole. Also, is allergic to augmentin [amoxicillin-pot clavulanate] and shellfish allergy.  Review of Systems  Constitutional: Negative for chills, fever and malaise/fatigue.  HENT: Negative for congestion, sinus pain and sore throat.   Eyes: Negative for blurred vision and pain.  Respiratory: Negative for cough and wheezing.   Cardiovascular: Negative for chest pain and leg swelling.  Gastrointestinal: Negative for abdominal pain, constipation, diarrhea, heartburn, nausea and vomiting.  Genitourinary: Negative for dysuria, frequency, hematuria and urgency.  Musculoskeletal: Negative for back pain, joint pain, myalgias and neck pain.  Skin: Negative for itching and rash.  Neurological: Negative for dizziness, tremors and weakness.    Endo/Heme/Allergies: Does not bruise/bleed easily.  Psychiatric/Behavioral: Negative for depression. The patient is not nervous/anxious and does not have insomnia.     Objective: BP 130/80   Pulse 87   Ht 5\' 3"  (1.6 m)   Wt 194 lb (88 kg)   BMI 34.37 kg/m  Physical Exam  Constitutional: She is oriented to person, place, and time. She appears well-developed and well-nourished. No distress.  Genitourinary: Vagina normal and uterus normal. Pelvic exam was performed with patient supine. There is no rash, tenderness or lesion on the right labia. There is no rash, tenderness or lesion on the left labia. No erythema or bleeding in the vagina. Right adnexum does not display mass and does not display tenderness. Left adnexum does not display mass and does not display tenderness. Cervix does not exhibit motion tenderness, discharge, polyp or nabothian cyst.   Uterus is mobile and midaxial. Uterus is not enlarged or exhibiting a mass.  Genitourinary Comments: + discharge   Abdominal: Soft. She exhibits no distension. There is no tenderness.  Musculoskeletal: Normal range of motion.  Neurological: She is alert and oriented to person, place, and time. No cranial nerve deficit.  Skin: Skin is warm and dry.  Psychiatric: She has a normal mood and affect.   WET PREP:   positive hyphae and positive whiff test  Findings are consistent with monilia vaginitis.   ASSESSMENT/PLAN:    Problem List Items Addressed This Visit    None    Visit Diagnoses    Acute vaginitis    -  Primary   Relevant Orders   Chlamydia/Gonococcus/Trichomonas, NAA   Yeast vaginitis        Treat w Derryl Harbor, MD, [facility], [facility] 05/05/2016  11:37 AM

## 2016-05-05 NOTE — Patient Instructions (Signed)

## 2016-05-07 LAB — CHLAMYDIA/GONOCOCCUS/TRICHOMONAS, NAA
CHLAMYDIA BY NAA: NEGATIVE
Gonococcus by NAA: NEGATIVE
Trich vag by NAA: NEGATIVE

## 2016-05-07 NOTE — Progress Notes (Signed)
Negative results d/w patient. Barnett Applebaum, MD, [facility], [place] [facility] 05/07/2016  10:35 AM

## 2016-06-16 ENCOUNTER — Telehealth: Payer: Self-pay

## 2016-06-16 ENCOUNTER — Other Ambulatory Visit: Payer: Self-pay | Admitting: Obstetrics and Gynecology

## 2016-06-16 DIAGNOSIS — N760 Acute vaginitis: Principal | ICD-10-CM

## 2016-06-16 DIAGNOSIS — B9689 Other specified bacterial agents as the cause of diseases classified elsewhere: Secondary | ICD-10-CM | POA: Insufficient documentation

## 2016-06-16 HISTORY — DX: Other specified bacterial agents as the cause of diseases classified elsewhere: B96.89

## 2016-06-16 MED ORDER — METRONIDAZOLE 500 MG PO TABS: 500.0000 mg | ORAL_TABLET | Freq: Two times a day (BID) | ORAL | 0 refills | Status: AC

## 2016-06-16 NOTE — Telephone Encounter (Signed)
PT with BV sx again. Rx flagyl eRxd. F/u prn.

## 2016-06-16 NOTE — Telephone Encounter (Signed)
Pt calling stating she has BV sxs again.  Wants [facility] to call her. [phone].

## 2016-06-16 NOTE — Progress Notes (Signed)
Pt with recurrent BV sx of d/c, irritation, and fishy odor. Yeast tx with [facility] 4/18 helped with those sx. Pt with hx of BV in past.

## 2016-06-16 NOTE — Telephone Encounter (Signed)
LMTRC

## 2016-08-08 ENCOUNTER — Telehealth: Payer: Self-pay

## 2016-08-08 NOTE — Telephone Encounter (Signed)
Duplicate encounter created in error. Ku

## 2016-08-08 NOTE — Telephone Encounter (Signed)
Pt states that when she felt for mirena IUD the strings felt higher than normal and bent. Pt is slightly spotting at this time with minimal cramping due to normal time for menstrual cycle. Pt denies excessive bleeding and severe pain. Advised pt normal for IUD strings to lay flat over cervix as they absorb moisture and become softer. Pt encouraged to make appt if she feels IUD is not in correct position.

## 2016-10-23 ENCOUNTER — Encounter: Payer: Self-pay | Admitting: Emergency Medicine

## 2016-10-23 ENCOUNTER — Emergency Department: Payer: Medicaid Other

## 2016-10-23 ENCOUNTER — Emergency Department
Admission: EM | Admit: 2016-10-23 | Discharge: 2016-10-23 | Disposition: A | Discharge status: Home / Self Care | Payer: Medicaid Other | Attending: Emergency Medicine | Admitting: Emergency Medicine

## 2016-10-23 DIAGNOSIS — J45909 Unspecified asthma, uncomplicated: Secondary | ICD-10-CM | POA: Insufficient documentation

## 2016-10-23 DIAGNOSIS — J020 Streptococcal pharyngitis: Secondary | ICD-10-CM | POA: Insufficient documentation

## 2016-10-23 DIAGNOSIS — Z79899 Other long term (current) drug therapy: Secondary | ICD-10-CM | POA: Insufficient documentation

## 2016-10-23 DIAGNOSIS — N83292 Other ovarian cyst, left side: Secondary | ICD-10-CM | POA: Diagnosis not present

## 2016-10-23 DIAGNOSIS — I10 Essential (primary) hypertension: Secondary | ICD-10-CM | POA: Insufficient documentation

## 2016-10-23 DIAGNOSIS — B9689 Other specified bacterial agents as the cause of diseases classified elsewhere: Secondary | ICD-10-CM

## 2016-10-23 DIAGNOSIS — N760 Acute vaginitis: Secondary | ICD-10-CM | POA: Insufficient documentation

## 2016-10-23 DIAGNOSIS — R102 Pelvic and perineal pain: Secondary | ICD-10-CM | POA: Diagnosis present

## 2016-10-23 DIAGNOSIS — N83202 Unspecified ovarian cyst, left side: Secondary | ICD-10-CM

## 2016-10-23 LAB — URINALYSIS, ROUTINE W REFLEX MICROSCOPIC
Bilirubin Urine: NEGATIVE
Glucose, UA: NEGATIVE mg/dL
HGB URINE DIPSTICK: NEGATIVE
Ketones, ur: NEGATIVE mg/dL
LEUKOCYTES UA: NEGATIVE
Nitrite: NEGATIVE
PROTEIN: NEGATIVE mg/dL
SPECIFIC GRAVITY, URINE: 1.016 (ref 1.005–1.030)
pH: 7 (ref 5.0–8.0)

## 2016-10-23 LAB — CBC WITH DIFFERENTIAL/PLATELET
BASOS ABS: 0 10*3/uL (ref 0–0.1)
Basophils Relative: 0 %
Eosinophils Absolute: 0.1 10*3/uL (ref 0–0.7)
Eosinophils Relative: 1 %
HEMATOCRIT: 40.9 % (ref 35.0–47.0)
HEMOGLOBIN: 14 g/dL (ref 12.0–16.0)
Lymphocytes Relative: 13 %
Lymphs Abs: 1.3 10*3/uL (ref 1.0–3.6)
MCH: 30.7 pg (ref 26.0–34.0)
MCHC: 34.2 g/dL (ref 32.0–36.0)
MCV: 89.8 fL (ref 80.0–100.0)
MONOS PCT: 8 %
Monocytes Absolute: 0.8 10*3/uL (ref 0.2–0.9)
NEUTROS ABS: 7.9 10*3/uL — AB (ref 1.4–6.5)
NEUTROS PCT: 78 %
Platelets: 186 10*3/uL (ref 150–440)
RBC: 4.55 MIL/uL (ref 3.80–5.20)
RDW: 12.3 % (ref 11.5–14.5)
WBC: 10 10*3/uL (ref 3.6–11.0)

## 2016-10-23 LAB — CHLAMYDIA/NGC RT PCR (ARMC ONLY)
Chlamydia Tr: NOT DETECTED
N gonorrhoeae: NOT DETECTED

## 2016-10-23 LAB — COMPREHENSIVE METABOLIC PANEL
ALT: 21 U/L (ref 14–54)
AST: 22 U/L (ref 15–41)
Albumin: 4.1 g/dL (ref 3.5–5.0)
Alkaline Phosphatase: 74 U/L (ref 38–126)
Anion gap: 10 (ref 5–15)
BUN: 10 mg/dL (ref 6–20)
CO2: 21 mmol/L — ABNORMAL LOW (ref 22–32)
Calcium: 8.7 mg/dL — ABNORMAL LOW (ref 8.9–10.3)
Chloride: 105 mmol/L (ref 101–111)
Creatinine, Ser: 0.75 mg/dL (ref 0.44–1.00)
GFR calc Af Amer: >60 mL/min (ref >60)
GFR calc non Af Amer: >60 mL/min (ref >60)
Glucose, Bld: 85 mg/dL (ref 65–99)
Potassium: 3.8 mmol/L (ref 3.5–5.1)
Sodium: 136 mmol/L (ref 135–145)
Total Bilirubin: 0.6 mg/dL (ref 0.3–1.2)
Total Protein: 6.8 g/dL (ref 6.5–8.1)

## 2016-10-23 LAB — WET PREP, GENITAL
Clue Cells Wet Prep HPF POC: NONE SEEN
Sperm: NONE SEEN
Trich, Wet Prep: NONE SEEN
Yeast Wet Prep HPF POC: NONE SEEN

## 2016-10-23 LAB — HCG, QUANTITATIVE, PREGNANCY

## 2016-10-23 LAB — POCT PREGNANCY, URINE: PREG TEST UR: NEGATIVE

## 2016-10-23 MED ORDER — AMOXICILLIN 500 MG PO TABS: 500.0000 mg | ORAL_TABLET | Freq: Two times a day (BID) | ORAL | 0 refills | Status: AC

## 2016-10-23 MED ORDER — METRONIDAZOLE 500 MG PO TABS
2000.0000 mg | ORAL_TABLET | Freq: Once | ORAL | Status: AC
  Administered 2016-10-23: 2000 mg via ORAL
  Filled 2016-10-23: qty 4

## 2016-10-23 NOTE — ED Provider Notes (Signed)
[facility] Emergency Department Provider Note  ____________________________________________  Time seen: Approximately 5:15 PM  I have reviewed the triage vital signs and the nursing notes.   HISTORY  Chief Complaint Sore Throat; Pelvic cramping; and Vaginal Discharge    HPI Becky Jackson is a 21 y.o. female presents to the emergency department with bilateral pelvic cramping for one week. Patient denies vaginal bleeding. She has experienced nausea today but no vomiting. She denies increased urinary frequency, hematuria and dysuria but has had flank pain. Patient states that she is in a monogamous relationship with one person and is not concerned for STDs. Patient had one prior STD, chlamydia which was treated appropriately. Patient states that she has had increased, cloudy vaginal discharge which is malodorous. Patient states that her symptoms are somewhat similar to when she had BP in the past. Patient secondarily reports pharyngitis. No alleviating measures have been attempted.   Past Medical History:  Diagnosis Date  . Acid reflux   . Asthma   . Hypertension     Patient Active Problem List   Diagnosis Date Noted  . BV (bacterial vaginosis) 06/16/2016  . Anemia 11/16/2012  . Chronic cholecystitis 10/26/2012  . Abdominal pain, other specified site 10/25/2012    Past Surgical History:  Procedure Laterality Date  . CHOLECYSTECTOMY  2014    Prior to Admission medications   Medication Sig Start Date End Date Taking? Authorizing Provider  albuterol (PROVENTIL HFA;VENTOLIN HFA) 108 (90 BASE) MCG/ACT inhaler Inhale 2 puffs into the lungs every 4 (four) hours as needed for wheezing or shortness of breath. 01/08/15   Paulette Blanch, MD  amoxicillin (AMOXIL) 500 MG tablet Take 1 tablet (500 mg total) by mouth 2 (two) times daily. 10/23/16 11/02/16  Lannie Fields, PA-C  atenolol (TENORMIN) 25 MG tablet Take 25 mg by mouth daily.    [provider]   azithromycin (ZITHROMAX) 250 MG tablet Take 1 tablet (250 mg total) by mouth daily. Patient not taking: Reported on 05/05/2016 01/08/15   Paulette Blanch, MD  chlorpheniramine-HYDROcodone [facility]) 10-8 MG/5ML SUER Take 5 mLs by mouth 2 (two) times daily. Patient not taking: Reported on 05/05/2016 11/04/15   Paulette Blanch, MD  dicyclomine (BENTYL) 20 MG tablet Take 1 tablet (20 mg total) by mouth 3 (three) times daily as needed for spasms. 05/02/16 05/02/17  Lavonia Drafts, MD  fluticasone (FLONASE) 50 MCG/ACT nasal spray Place 2 sprays into both nostrils daily. Patient not taking: Reported on 05/05/2016 01/08/16 01/07/17  Laban Emperor, PA-C  ibuprofen (ADVIL,MOTRIN) 800 MG tablet Take 1 tablet (800 mg total) by mouth every 8 (eight) hours as needed. Patient not taking: Reported on 05/05/2016 01/12/16   Gregor Hams, MD  levonorgestrel [facility]) 20 MCG/24HR IUD 1 each by Intrauterine route once.    [provider]  montelukast (SINGULAIR) 10 MG tablet Take 10 mg by mouth as needed.    [provider]  nitrofurantoin (MACRODANTIN) 100 MG capsule Take 1 capsule (100 mg total) by mouth 2 (two) times daily. Patient not taking: Reported on 05/05/2016 03/21/15   Carrie Mew, MD  omeprazole (PRILOSEC) 20 MG capsule Take 20 mg by mouth daily.    [provider]  predniSONE (DELTASONE) 20 MG tablet 3 tablets daily 4 days Patient not taking: Reported on 05/05/2016 01/08/15   Paulette Blanch, MD  promethazine (PHENERGAN) 25 MG tablet Take 1 tablet (25 mg total) by mouth every 6 (six) hours as needed for  nausea or vomiting. 05/05/16   Gae Dry, MD  ranitidine (ZANTAC) 150 MG capsule Take 1 capsule (150 mg total) by mouth 2 (two) times daily. 03/21/15   Carrie Mew, MD  terconazole (TERAZOL 7) 0.4 % vaginal cream Place 1 applicator vaginally at bedtime. 05/05/16   Gae Dry, MD    Allergies Augmentin [amoxicillin-pot clavulanate] and Shellfish allergy  No  family history on file.  Social History Social History  Substance Use Topics  . Smoking status: Never Smoker  . Smokeless tobacco: Never Used  . Alcohol use No     Review of Systems  Constitutional: No fever/chills Eyes: No visual changes. No discharge ENT: Patient has pharyngitis. Cardiovascular: no chest pain. Respiratory: no cough. No SOB. Gastrointestinal: Patient has nausea.  Genitourinary: + for bilateral pelvic cramping and increased cloudy vaginal discharge. Musculoskeletal: Negative for musculoskeletal pain. Skin: Negative for rash, abrasions, lacerations, ecchymosis. Neurological: Negative for headaches, focal weakness or numbness.   ____________________________________________   PHYSICAL EXAM:  VITAL SIGNS: ED Triage Vitals [10/23/16 1633]  Enc Vitals Group     BP (!) 142/86     Pulse Rate (!) 104     Resp 18     Temp 98.9 F (37.2 C)     Temp Source Oral     SpO2 100 %     Weight 190 lb (86.2 kg)     Height 5\' 2"  (1.575 m)     Head Circumference      Peak Flow      Pain Score 9     Pain Loc      Pain Edu?      Excl. in [place]?      Constitutional: Alert and oriented. Well appearing and in no acute distress. Eyes: Conjunctivae are normal. PERRL. EOMI. Head: Atraumatic. ENT:      Ears: Tympanic membranes are pearly bilaterally.       Nose: No congestion/rhinnorhea.      Mouth/Throat: Mucous membranes are moist. Posterior pharynx is erythematous with tonsillar exudate. Uvula is midline. Hematological/Lymphatic/Immunilogical: Palpable cervical lymphadenopathy. Cardiovascular: Normal rate, regular rhythm. Normal S1 and S2.  Good peripheral circulation. Respiratory: Normal respiratory effort without tachypnea or retractions. Lungs CTAB. Good air entry to the bases with no decreased or absent breath sounds. Gastrointestinal: Bowel sounds 4 quadrants. Soft and nontender to palpation. No guarding or rigidity. No palpable masses. No distention. No CVA  tenderness. Genitourinary: Copious gray-white vaginal discharge in vaginal vault. No cervical motion tenderness was elicited. Skin:  Skin is warm, dry and intact. No rash noted. Psychiatric: Mood and affect are normal. Speech and behavior are normal. Patient exhibits appropriate insight and judgement.   ____________________________________________   LABS (all labs ordered are listed, but only abnormal results are displayed)  Labs Reviewed  WET PREP, GENITAL - Abnormal; Notable for the following:       Result Value   WBC, Wet Prep HPF POC FEW (*)    All other components within normal limits  URINALYSIS, ROUTINE W REFLEX MICROSCOPIC - Abnormal; Notable for the following:    Color, Urine YELLOW (*)    APPearance CLEAR (*)    All other components within normal limits  COMPREHENSIVE METABOLIC PANEL - Abnormal; Notable for the following:    CO2 21 (*)    Calcium 8.7 (*)    All other components within normal limits  CBC WITH DIFFERENTIAL/PLATELET - Abnormal; Notable for the following:    Neutro Abs 7.9 (*)    All other  components within normal limits  CHLAMYDIA/NGC RT PCR ([facility] ONLY)  HCG, QUANTITATIVE, PREGNANCY  CBC WITH DIFFERENTIAL/PLATELET  POC URINE PREG, ED  POCT PREGNANCY, URINE   ____________________________________________  EKG   ____________________________________________  RADIOLOGY   US Pelvic Complete With Transvaginal  Result Date: 10/23/2016 CLINICAL DATA:  Pelvic pain for 1 week EXAM: TRANSABDOMINAL AND TRANSVAGINAL ULTRASOUND OF PELVIS TECHNIQUE: Both transabdominal and transvaginal ultrasound examinations of the pelvis were performed. Transabdominal technique was performed for global imaging of the pelvis including uterus, ovaries, adnexal regions, and pelvic cul-de-sac. It was necessary to proceed with endovaginal exam following the transabdominal exam to visualize the endometrium and ovaries. COMPARISON:  CT 04/28/2016 FINDINGS: Uterus  Measurements: 7.6 x 3.1 x 4.1 cm. No fibroids or other mass visualized. Endometrium Thickness: 2.9 mm. Intrauterine device grossly appropriate in position. Right ovary Measurements: 3.1 x 1.7 x 1.8 cm. Normal appearance/no adnexal mass. Left ovary Measurements: 3.8 x 1.8 x 2.7 cm. Complex cystic mass left ovary measuring 1.5 x 1.6 x 1.3 cm Other findings Small amount of free fluid IMPRESSION: 1. Small free fluid in the pelvis 2. Complex cyst left ovary measuring 1.6 cm, possibly hemorrhagic, suggest 6-12 week pelvic ultrasound follow-up to ensure resolution 3. No others definitive abnormalities are seen Electronically Signed   By: Donavan Foil M.D.   On: 10/23/2016 20:18    ____________________________________________    PROCEDURES  Procedure(s) performed:    Procedures    Medications  metroNIDAZOLE (FLAGYL) tablet 2,000 mg (2,000 mg Oral Given 10/23/16 2054)     ____________________________________________   INITIAL IMPRESSION / ASSESSMENT AND PLAN / ED COURSE  Pertinent labs & imaging results that were available during my care of the patient were reviewed by me and considered in my medical decision making (see chart for details).  Review of the [place] CSRS was performed in accordance of the [place] prior to dispensing any controlled drugs.     Assessment and Plan: Ovarian cyst Pelvic pain Bacterial vaginosis Strep pharyngitis Patient presents to the emergency department with bilateral pelvic cramping for the past week. Ultrasound examination reveals a possible hemorrhagic left ovarian cyst. History and physical exam findings are also consistent with bacterial vaginosis. Patient's physical exam was also concerning for strep pharyngitis. Patient was given Flagyl in the emergency department. Repeat ultrasound examination was recommended in 6-12 weeks to ensure resolution of ovarian cyst. Patient was discharged with amoxicillin. Patient was advised to return to the emergency department  for any new or worsening symptoms. Patient voiced understanding.All patient questions were answered.   ____________________________________________  FINAL CLINICAL IMPRESSION(S) / ED DIAGNOSES  Final diagnoses:  Pelvic pain  Cyst of left ovary  BV (bacterial vaginosis)  Strep pharyngitis      NEW MEDICATIONS STARTED DURING THIS VISIT:  Discharge Medication List as of 10/23/2016  8:39 PM    START taking these medications   Details  amoxicillin (AMOXIL) 500 MG tablet Take 1 tablet (500 mg total) by mouth 2 (two) times daily., Starting Thu 10/23/2016, Until Sun 11/02/2016, Print            This chart was dictated using voice recognition software/Dragon. Despite best efforts to proofread, errors can occur which can change the meaning. Any change was purely unintentional.    Lannie Fields, PA-C 10/23/16 2142    Nena Polio, MD 10/24/16 1341

## 2016-10-23 NOTE — ED Notes (Signed)

## 2016-10-23 NOTE — ED Notes (Signed)
See triage note  States she developed abd cramping and lower back pain a few days ago  Then developed sore throat this am  Unsure of fever at home   Low grade on arrival

## 2016-10-23 NOTE — ED Triage Notes (Signed)
Pt reports pelvic cramping and cloudy vaginal discharge for approximately one week. Pt reports sore throat that radiates to left ear that began this morning. Denies NVD. Denies fever.

## 2017-02-24 ENCOUNTER — Emergency Department
Admission: EM | Admit: 2017-02-24 | Discharge: 2017-02-24 | Disposition: A | Discharge status: Home / Self Care | Payer: Medicaid Other | Attending: Emergency Medicine | Admitting: Emergency Medicine

## 2017-02-24 ENCOUNTER — Encounter: Payer: Self-pay | Admitting: Emergency Medicine

## 2017-02-24 ENCOUNTER — Other Ambulatory Visit: Payer: Self-pay

## 2017-02-24 DIAGNOSIS — I10 Essential (primary) hypertension: Secondary | ICD-10-CM | POA: Insufficient documentation

## 2017-02-24 DIAGNOSIS — J45909 Unspecified asthma, uncomplicated: Secondary | ICD-10-CM | POA: Insufficient documentation

## 2017-02-24 DIAGNOSIS — B9789 Other viral agents as the cause of diseases classified elsewhere: Secondary | ICD-10-CM | POA: Insufficient documentation

## 2017-02-24 DIAGNOSIS — J069 Acute upper respiratory infection, unspecified: Secondary | ICD-10-CM

## 2017-02-24 DIAGNOSIS — Z79899 Other long term (current) drug therapy: Secondary | ICD-10-CM | POA: Insufficient documentation

## 2017-02-24 LAB — GROUP A STREP BY PCR: Group A Strep by PCR: NOT DETECTED

## 2017-02-24 MED ORDER — PSEUDOEPH-BROMPHEN-DM 30-2-10 MG/5ML PO SYRP: 5.0000 mL | ORAL_SOLUTION | Freq: Four times a day (QID) | ORAL | 0 refills | Status: DC | PRN

## 2017-02-24 NOTE — ED Triage Notes (Signed)
C/O cough, sore throat, sinus congestion.    AAOx3.  Skin warm and dry. NAD.  No SOB/ DOE.  Voice clear and strong.

## 2017-02-24 NOTE — Discharge Instructions (Signed)
Follow-up with your doctor if any continued problems or concerns.  Continue taking Tylenol or ibuprofen as needed for fever or muscle aches.  Increase fluids.  Begin taking Bromfed-DM as needed for cough and congestion.  You may also use saline nose spray for nasal congestion.

## 2017-02-24 NOTE — ED Notes (Signed)
Pt request not PA Suanne Marker agreed this RN printed note and signed.

## 2017-02-24 NOTE — ED Provider Notes (Signed)
[facility] Emergency Department Provider Note  ____________________________________________   First MD Initiated Contact with Patient 02/24/17 1116     (approximate)  I have reviewed the triage vital signs and the nursing notes.   HISTORY  Chief Complaint Sore Throat   HPI Becky Jackson is a 22 y.o. female presents with complaint of cough, sore throat, sinus congestion.  Patient states that she has not had any shortness of breath.  She is unaware of any fever.  She has taken Tylenol/ibuprofen for muscle aches.  She states the medication she has been taking for her symptoms has not helped.  Rates her pain as 7 out of 10.   Past Medical History:  Diagnosis Date  . Acid reflux   . Asthma   . Hypertension     Patient Active Problem List   Diagnosis Date Noted  . BV (bacterial vaginosis) 06/16/2016  . Anemia 11/16/2012  . Chronic cholecystitis 10/26/2012  . Abdominal pain, other specified site 10/25/2012    Past Surgical History:  Procedure Laterality Date  . CHOLECYSTECTOMY  2014    Prior to Admission medications   Medication Sig Start Date End Date Taking? Authorizing Provider  albuterol (PROVENTIL HFA;VENTOLIN HFA) 108 (90 BASE) MCG/ACT inhaler Inhale 2 puffs into the lungs every 4 (four) hours as needed for wheezing or shortness of breath. 01/08/15   Paulette Blanch, MD  atenolol (TENORMIN) 25 MG tablet Take 25 mg by mouth daily.    [provider]  brompheniramine-pseudoephedrine-DM 30-2-10 MG/5ML syrup Take 5 mLs by mouth 4 (four) times daily as needed. 02/24/17   Johnn Hai, PA-C  levonorgestrel (MIRENA) 20 MCG/24HR IUD 1 each by Intrauterine route once.    [provider]  montelukast (SINGULAIR) 10 MG tablet Take 10 mg by mouth as needed.    [provider]  omeprazole (PRILOSEC) 20 MG capsule Take 20 mg by mouth daily.    [provider]    Allergies Augmentin [amoxicillin-pot clavulanate] and  Shellfish allergy  No family history on file.  Social History Social History   Tobacco Use  . Smoking status: Never Smoker  . Smokeless tobacco: Never Used  Substance Use Topics  . Alcohol use: No  . Drug use: No    Review of Systems Constitutional: Subjective fever/chills Eyes: No visual changes. ENT: Positive sore throat.  Positive sinus congestion. Cardiovascular: Denies chest pain. Respiratory: Denies shortness of breath.  Positive cough. Gastrointestinal: No abdominal pain.  No nausea, no vomiting.  Musculoskeletal: Negative for back pain. Skin: Negative for rash. Neurological: Negative for headaches, focal weakness or numbness. ____________________________________________   PHYSICAL EXAM:  VITAL SIGNS: ED Triage Vitals  Enc Vitals Group     BP 02/24/17 1018 125/83     Pulse Rate 02/24/17 1018 91     Resp 02/24/17 1018 16     Temp 02/24/17 1018 97.8 F (36.6 C)     Temp Source 02/24/17 1018 Oral     SpO2 02/24/17 1020 97 %     Weight 02/24/17 1021 195 lb (88.5 kg)     Height 02/24/17 1021 5\' 3"  (1.6 m)     Head Circumference --      Peak Flow --      Pain Score 02/24/17 1021 7     Pain Loc --      Pain Edu? --      Excl. in [place]? --    Constitutional: Alert and oriented. Well appearing and in  no acute distress. Eyes: Conjunctivae are normal. PERRL. EOMI. Head: Atraumatic. Nose: Mild congestion/rhinnorhea.  TMs are dull bilaterally. Mouth/Throat: Mucous membranes are moist.  Oropharynx non-erythematous.  Posterior drainage present.  No exudate and uvula is midline. Neck: No stridor.   Hematological/Lymphatic/Immunilogical: No cervical lymphadenopathy. Cardiovascular: Normal rate, regular rhythm. Grossly normal heart sounds.  Good peripheral circulation. Respiratory: Normal respiratory effort.  No retractions. Lungs CTAB. Musculoskeletal: His upper and lower extremities without any difficulty.  Normal gait was noted. Neurologic:  Normal speech and language.  No gross focal neurologic deficits are appreciated. No gait instability. Skin:  Skin is warm, dry and intact. No rash noted. Psychiatric: Mood and affect are normal. Speech and behavior are normal.  ____________________________________________   LABS (all labs ordered are listed, but only abnormal results are displayed)  Labs Reviewed  GROUP A STREP BY PCR     PROCEDURES  Procedure(s) performed: None  Procedures  Critical Care performed: No  ____________________________________________   INITIAL IMPRESSION / ASSESSMENT AND PLAN / ED COURSE Patient was made aware that strep test was negative.  Patient was told this most likely is a viral illness and to continue taking Tylenol or ibuprofen as needed for symptoms.  She was given a prescription for Bromfed-DM as she has not taken anything over-the-counter that has helped with her cough and congestion.  She is encouraged to use saline nasal spray as needed for nasal congestion.  She is to follow-up with her PCP if any continued problems. ____________________________________________   FINAL CLINICAL IMPRESSION(S) / ED DIAGNOSES  Final diagnoses:  Viral URI with cough     ED Discharge Orders        Ordered    brompheniramine-pseudoephedrine-DM 30-2-10 MG/5ML syrup  4 times daily PRN     02/24/17 1134       Note:  This document was prepared using Dragon voice recognition software and may include unintentional dictation errors.    Johnn Hai, PA-C 02/24/17 1535    Schaevitz, Randall An, MD 02/24/17 1539

## 2017-02-24 NOTE — ED Notes (Signed)
See triage note  Presents with sore throat and congestion for about 3 days   Afebrile on arrival

## 2017-03-17 ENCOUNTER — Telehealth: Payer: Self-pay

## 2017-03-17 NOTE — Telephone Encounter (Signed)
Pt requesting antibiotic for BV. Advised needs office visit. Last seen in April 2018 for vaginitis by Dr Kenton Kingfisher. Needs annual also. Pt aware and transferred to front desk for scheduling.

## 2017-03-17 NOTE — Telephone Encounter (Signed)
Pt called stating she needed an antibiotic.   Left msg for pt to call back to find out what she needed medication for and would most likely need to schedule appt.   Pt cb# [phone]

## 2017-03-19 ENCOUNTER — Encounter: Payer: Self-pay | Admitting: Obstetrics and Gynecology

## 2017-03-19 ENCOUNTER — Ambulatory Visit: Payer: Self-pay | Admitting: Obstetrics and Gynecology

## 2017-03-19 VITALS — BP 118/76 | Ht 63.0 in | Wt 198.0 lb

## 2017-03-19 DIAGNOSIS — B9689 Other specified bacterial agents as the cause of diseases classified elsewhere: Secondary | ICD-10-CM

## 2017-03-19 DIAGNOSIS — N760 Acute vaginitis: Secondary | ICD-10-CM

## 2017-03-19 LAB — POCT WET PREP WITH KOH
CLUE CELLS WET PREP PER HPF POC: POSITIVE
KOH Prep POC: POSITIVE — AB
Trichomonas, UA: NEGATIVE
Yeast Wet Prep HPF POC: NEGATIVE

## 2017-03-19 MED ORDER — CLINDAMYCIN PHOSPHATE (1 DOSE) 2 % VA CREA: TOPICAL_CREAM | VAGINAL | 0 refills | Status: DC

## 2017-03-19 NOTE — Progress Notes (Signed)
Chief Complaint  Patient presents with  . Vaginitis    HPI:      Ms. Becky Jackson is a 22 y.o. G1P0101 who LMP was Patient's last menstrual period was 03/08/2017., presents today for BV sx of increased vag d/c, odor, mild irritation, mild pelvic discomfort. No urin sx. Sex active, has Mirena, no new partners. Treated for BV  with flagyl 5/18 and 9/18. Has small ovar cyst in ED 9/18. Sx improved. Uses dove sens skin soap/no dryer sheets which helped, but pt changed soaps recently.   Pt is self-pay  Past Medical History:  Diagnosis Date  . Acid reflux   . Anxiety   . Asthma   . Asthma   . Chlamydia   . GERD (gastroesophageal reflux disease)   . Hypertension   . Irritable bowel syndrome   . Obesity     Past Surgical History:  Procedure Laterality Date  . CHOLECYSTECTOMY  2014    Family History  Problem Relation Age of Onset  . Hypertension Mother   . Diabetes Mother     Social History   Socioeconomic History  . Marital status: Single    Spouse name: Not on file  . Number of children: Not on file  . Years of education: Not on file  . Highest education level: Not on file  Social Needs  . Financial resource strain: Not on file  . Food insecurity - worry: Not on file  . Food insecurity - inability: Not on file  . Transportation needs - medical: Not on file  . Transportation needs - non-medical: Not on file  Occupational History  . Not on file  Tobacco Use  . Smoking status: Never Smoker  . Smokeless tobacco: Never Used  Substance and Sexual Activity  . Alcohol use: No  . Drug use: No  . Sexual activity: Yes    Birth control/protection: IUD  Other Topics Concern  . Not on file  Social History Narrative  . Not on file     Current Outpatient Medications:  .  levonorgestrel (MIRENA) 20 MCG/24HR IUD, 1 each by Intrauterine route once., Disp: , Rfl:  .  albuterol (PROVENTIL HFA;VENTOLIN HFA) 108 (90 BASE) MCG/ACT inhaler, Inhale 2 puffs into the lungs  every 4 (four) hours as needed for wheezing or shortness of breath. (Patient not taking: Reported on 03/19/2017), Disp: 1 Inhaler, Rfl: 0 .  atenolol (TENORMIN) 25 MG tablet, Take 25 mg by mouth daily., Disp: , Rfl:  .  brompheniramine-pseudoephedrine-DM 30-2-10 MG/5ML syrup, Take 5 mLs by mouth 4 (four) times daily as needed. (Patient not taking: Reported on 03/19/2017), Disp: 120 mL, Rfl: 0 .  Clindamycin Phosphate, 1 Dose, (CLINDESSE) vaginal cream, Insert 1 applicator vaginally one dose, 1 sample given, Disp: 5.8 g, Rfl: 0 .  montelukast (SINGULAIR) 10 MG tablet, Take 10 mg by mouth as needed., Disp: , Rfl:  .  omeprazole (PRILOSEC) 20 MG capsule, Take 20 mg by mouth daily., Disp: , Rfl:    ROS:  Review of Systems  Constitutional: Negative for fever.  Gastrointestinal: Negative for blood in stool, constipation, diarrhea, nausea and vomiting.  Genitourinary: Positive for vaginal discharge. Negative for dyspareunia, dysuria, flank pain, frequency, hematuria, urgency, vaginal bleeding and vaginal pain.  Musculoskeletal: Negative for back pain.  Skin: Negative for rash.     OBJECTIVE:   Vitals:  BP 118/76   Ht 5\' 3"  (1.6 m)   Wt 198 lb (89.8 kg)   LMP 03/08/2017   BMI  35.07 kg/m   Physical Exam  Constitutional: She is oriented to person, place, and time and well-developed, well-nourished, and in no distress. Vital signs are normal.  Genitourinary: Uterus normal, cervix normal, right adnexa normal, left adnexa normal and vulva normal. Uterus is not enlarged. Cervix exhibits no motion tenderness and no tenderness. Right adnexum displays no mass and no tenderness. Left adnexum displays no mass and no tenderness. Vulva exhibits no erythema, no exudate, no lesion, no rash and no tenderness. Vagina exhibits no lesion. Thin  odorless  white and vaginal discharge found.  Neurological: She is oriented to person, place, and time.  Vitals reviewed.   Results: Results for orders placed or  performed in visit on 03/19/17 (from the past 24 hour(s))  POCT Wet Prep with KOH     Status: Abnormal   Collection Time: 03/19/17 10:19 AM  Result Value Ref Range   Trichomonas, UA Negative    Clue Cells Wet Prep HPF POC pos    Epithelial Wet Prep HPF POC  Few, Moderate, Many, Too numerous to count   Yeast Wet Prep HPF POC neg    Bacteria Wet Prep HPF POC  Few   RBC Wet Prep HPF POC     WBC Wet Prep HPF POC     KOH Prep POC Positive (A) Negative     Assessment/Plan: Bacterial vaginosis - Rx clindesse one dose (sample given to pt since self-pay). Try different abx for BV. Add probiotics/resume dove sens skin soap. Will RF if sx recur. F/u prn. - Plan: POCT Wet Prep with KOH, Clindamycin Phosphate, 1 Dose, (CLINDESSE) vaginal cream    Meds ordered this encounter  Medications  . Clindamycin Phosphate, 1 Dose, (CLINDESSE) vaginal cream    Sig: Insert 1 applicator vaginally one dose, 1 sample given    Dispense:  5.8 g    Refill:  0    Order Specific Question:   Supervising Provider    Answer:   Gae Dry [[id number]]      Return if symptoms worsen or fail to improve.  Klea Nall B. Lakena Sparlin, PA-C 03/19/2017 10:21 AM

## 2017-03-19 NOTE — Patient Instructions (Signed)
I value your feedback and entrusting us with your care. If you get a [facility] patient survey, I would appreciate you taking the time to let us know about your experience today. Thank you! 

## 2017-04-15 ENCOUNTER — Emergency Department
Admission: EM | Admit: 2017-04-15 | Discharge: 2017-04-15 | Disposition: A | Discharge status: Home / Self Care | Payer: Self-pay | Attending: Emergency Medicine | Admitting: Emergency Medicine

## 2017-04-15 ENCOUNTER — Encounter: Payer: Self-pay | Admitting: Emergency Medicine

## 2017-04-15 DIAGNOSIS — R110 Nausea: Secondary | ICD-10-CM | POA: Insufficient documentation

## 2017-04-15 DIAGNOSIS — J45909 Unspecified asthma, uncomplicated: Secondary | ICD-10-CM | POA: Insufficient documentation

## 2017-04-15 DIAGNOSIS — R197 Diarrhea, unspecified: Secondary | ICD-10-CM | POA: Insufficient documentation

## 2017-04-15 DIAGNOSIS — A084 Viral intestinal infection, unspecified: Secondary | ICD-10-CM | POA: Insufficient documentation

## 2017-04-15 DIAGNOSIS — Z79899 Other long term (current) drug therapy: Secondary | ICD-10-CM | POA: Insufficient documentation

## 2017-04-15 DIAGNOSIS — I10 Essential (primary) hypertension: Secondary | ICD-10-CM | POA: Insufficient documentation

## 2017-04-15 LAB — COMPREHENSIVE METABOLIC PANEL
ALBUMIN: 5 g/dL (ref 3.5–5.0)
ALT: 18 U/L (ref 14–54)
ANION GAP: 10 (ref 5–15)
AST: 19 U/L (ref 15–41)
Alkaline Phosphatase: 93 U/L (ref 38–126)
BILIRUBIN TOTAL: 1.1 mg/dL (ref 0.3–1.2)
BUN: 20 mg/dL (ref 6–20)
CO2: 23 mmol/L (ref 22–32)
Calcium: 9.4 mg/dL (ref 8.9–10.3)
Chloride: 105 mmol/L (ref 101–111)
Creatinine, Ser: 0.85 mg/dL (ref 0.44–1.00)
GFR calc Af Amer: >60 mL/min (ref >60)
GLUCOSE: 101 mg/dL — AB (ref 65–99)
POTASSIUM: 3.9 mmol/L (ref 3.5–5.1)
Sodium: 138 mmol/L (ref 135–145)
TOTAL PROTEIN: 8.1 g/dL (ref 6.5–8.1)

## 2017-04-15 LAB — URINALYSIS, COMPLETE (UACMP) WITH MICROSCOPIC
BACTERIA UA: NONE SEEN
Bilirubin Urine: NEGATIVE
Glucose, UA: NEGATIVE mg/dL
HGB URINE DIPSTICK: NEGATIVE
Ketones, ur: 20 mg/dL — AB
Leukocytes, UA: NEGATIVE
NITRITE: NEGATIVE
PROTEIN: 30 mg/dL — AB
Specific Gravity, Urine: 1.033 — ABNORMAL HIGH (ref 1.005–1.030)
pH: 5 (ref 5.0–8.0)

## 2017-04-15 LAB — PREGNANCY, URINE: PREG TEST UR: NEGATIVE

## 2017-04-15 LAB — CBC
HEMATOCRIT: 46.9 % (ref 35.0–47.0)
HEMOGLOBIN: 15.8 g/dL (ref 12.0–16.0)
MCH: 30.4 pg (ref 26.0–34.0)
MCHC: 33.6 g/dL (ref 32.0–36.0)
MCV: 90.4 fL (ref 80.0–100.0)
Platelets: 231 10*3/uL (ref 150–440)
RBC: 5.19 MIL/uL (ref 3.80–5.20)
RDW: 12.5 % (ref 11.5–14.5)
WBC: 7.7 10*3/uL (ref 3.6–11.0)

## 2017-04-15 LAB — LIPASE, BLOOD: Lipase: 21 U/L (ref 11–51)

## 2017-04-15 MED ORDER — ONDANSETRON HCL 4 MG/2ML IJ SOLN
4.0000 mg | Freq: Once | INTRAMUSCULAR | Status: AC
  Administered 2017-04-15: 4 mg via INTRAVENOUS
  Filled 2017-04-15: qty 2

## 2017-04-15 MED ORDER — GI COCKTAIL ~~LOC~~
30.0000 mL | Freq: Once | ORAL | Status: AC
  Administered 2017-04-15: 30 mL via ORAL
  Filled 2017-04-15: qty 30

## 2017-04-15 MED ORDER — ALUM & MAG HYDROXIDE-SIMETH 400-400-40 MG/5ML PO SUSP: 5.0000 mL | Freq: Four times a day (QID) | ORAL | 0 refills | Status: DC | PRN

## 2017-04-15 MED ORDER — SODIUM CHLORIDE 0.9 % IV BOLUS (SEPSIS)
1000.0000 mL | Freq: Once | INTRAVENOUS | Status: AC
  Administered 2017-04-15: 1000 mL via INTRAVENOUS

## 2017-04-15 MED ORDER — KETOROLAC TROMETHAMINE 30 MG/ML IJ SOLN
15.0000 mg | Freq: Once | INTRAMUSCULAR | Status: AC
  Administered 2017-04-15: 15 mg via INTRAVENOUS
  Filled 2017-04-15: qty 1

## 2017-04-15 MED ORDER — ONDANSETRON 4 MG PO TBDP: 4.0000 mg | ORAL_TABLET | Freq: Three times a day (TID) | ORAL | 0 refills | Status: DC | PRN

## 2017-04-15 NOTE — ED Triage Notes (Signed)
Pt with abd cramping and nausea since Monday with diarrhea.

## 2017-04-15 NOTE — ED Notes (Signed)
First Nurse Note:  Patient to room 18.  Mickel Baas RN aware of placement.

## 2017-04-15 NOTE — ED Provider Notes (Signed)
[facility] Emergency Department Provider Note  ____________________________________________  Time seen: Approximately 11:24 AM  I have reviewed the triage vital signs and the nursing notes.   HISTORY  Chief Complaint Abdominal Pain and Nausea   HPI Becky Jackson is a 22 y.o. female with a history of GERD, IBS, asthma, hypertension who presents for evaluation of abdominal pain. Patient reports 2 days of diffuse cramping abdominal pain, constant, moderate associated with severe nausea and several daily episodes of watery diarrhea and chills. No h/o C. Diff, no recent abx use. no fever, no dysuria, no hematuria, no vaginal discharge, no vaginal bleeding. Patient has an IUD. Patient has had a cholecystectomy several years ago but no other abdominal surgeries.  Past Medical History:  Diagnosis Date  . Acid reflux   . Anxiety   . Asthma   . Asthma   . Chlamydia   . GERD (gastroesophageal reflux disease)   . Hypertension   . Irritable bowel syndrome   . Obesity     Patient Active Problem List   Diagnosis Date Noted  . BV (bacterial vaginosis) 06/16/2016  . Anemia 11/16/2012  . Chronic cholecystitis 10/26/2012  . Abdominal pain, other specified site 10/25/2012    Past Surgical History:  Procedure Laterality Date  . CHOLECYSTECTOMY  2014    Prior to Admission medications   Medication Sig Start Date End Date Taking? Authorizing Provider  levonorgestrel (MIRENA) 20 MCG/24HR IUD 1 each by Intrauterine route once.   Yes [provider]  albuterol (PROVENTIL HFA;VENTOLIN HFA) 108 (90 BASE) MCG/ACT inhaler Inhale 2 puffs into the lungs every 4 (four) hours as needed for wheezing or shortness of breath. Patient not taking: Reported on 03/19/2017 01/08/15   Paulette Blanch, MD  alum & mag hydroxide-simeth (MAALOX MAX) 400-400-40 MG/5ML suspension Take 5 mLs by mouth every 6 (six) hours as needed for indigestion. 04/15/17   Rudene Re, MD    atenolol (TENORMIN) 25 MG tablet Take 25 mg by mouth daily.    [provider]  brompheniramine-pseudoephedrine-DM 30-2-10 MG/5ML syrup Take 5 mLs by mouth 4 (four) times daily as needed. Patient not taking: Reported on 03/19/2017 02/24/17   Johnn Hai, PA-C  Clindamycin Phosphate, 1 Dose, (CLINDESSE) vaginal cream Insert 1 applicator vaginally one dose, 1 sample given Patient not taking: Reported on 04/15/2017 9/98/33   Copland, Elmo Putt B, PA-C  ondansetron (ZOFRAN ODT) 4 MG disintegrating tablet Take 1 tablet (4 mg total) by mouth every 8 (eight) hours as needed for nausea or vomiting. 04/15/17   Rudene Re, MD    Allergies Augmentin [amoxicillin-pot clavulanate] and Shellfish allergy  Family History  Problem Relation Age of Onset  . Hypertension Mother   . Diabetes Mother     Social History Social History   Tobacco Use  . Smoking status: Never Smoker  . Smokeless tobacco: Never Used  Substance Use Topics  . Alcohol use: No  . Drug use: No    Review of Systems  Constitutional: Negative for fever. + chills Eyes: Negative for visual changes. ENT: Negative for sore throat. Neck: No neck pain  Cardiovascular: Negative for chest pain. Respiratory: Negative for shortness of breath. Gastrointestinal: + abdominal pain, nausea, and diarrhea. No vomiting  Genitourinary: Negative for dysuria. Musculoskeletal: Negative for back pain. Skin: Negative for rash. Neurological: Negative for headaches, weakness or numbness. Psych: No SI or HI  ____________________________________________   PHYSICAL EXAM:  VITAL SIGNS: ED Triage Vitals [04/15/17 0903]  Enc Vitals Group  BP (!) 150/78     Pulse Rate (!) 117     Resp 20     Temp 98.6 F (37 C)     Temp Source Oral     SpO2 100 %     Weight 198 lb (89.8 kg)     Height      Head Circumference      Peak Flow      Pain Score 8     Pain Loc      Pain Edu?      Excl. in [place]?     Constitutional: Alert  and oriented. Well appearing and in no apparent distress. HEENT:      Head: Normocephalic and atraumatic.         Eyes: Conjunctivae are normal. Sclera is non-icteric.       Mouth/Throat: Mucous membranes are moist.       Neck: Supple with no signs of meningismus. Cardiovascular: tachycardic with regular rhythm. No murmurs, gallops, or rubs. 2+ symmetrical distal pulses are present in all extremities. No JVD. Respiratory: Normal respiratory effort. Lungs are clear to auscultation bilaterally. No wheezes, crackles, or rhonchi.  Gastrointestinal: Soft, mild ttp worse on the upper quadrants, and non distended with positive bowel sounds. No rebound or guarding. Genitourinary: No CVA tenderness. Musculoskeletal: Nontender with normal range of motion in all extremities. No edema, cyanosis, or erythema of extremities. Neurologic: Normal speech and language. Face is symmetric. Moving all extremities. No gross focal neurologic deficits are appreciated. Skin: Skin is warm, dry and intact. No rash noted. Psychiatric: Mood and affect are normal. Speech and behavior are normal.  ____________________________________________   LABS (all labs ordered are listed, but only abnormal results are displayed)  Labs Reviewed  COMPREHENSIVE METABOLIC PANEL - Abnormal; Notable for the following components:      Result Value   Glucose, Bld 101 (*)    All other components within normal limits  URINALYSIS, COMPLETE (UACMP) WITH MICROSCOPIC - Abnormal; Notable for the following components:   Color, Urine AMBER (*)    APPearance HAZY (*)    Specific Gravity, Urine 1.033 (*)    Ketones, ur 20 (*)    Protein, ur 30 (*)    Squamous Epithelial / LPF 0-5 (*)    All other components within normal limits  LIPASE, BLOOD  CBC  PREGNANCY, URINE   ____________________________________________  EKG  none  ____________________________________________  RADIOLOGY  none   ____________________________________________   PROCEDURES  Procedure(s) performed: None Procedures Critical Care performed:  None ____________________________________________   INITIAL IMPRESSION / ASSESSMENT AND PLAN / ED COURSE  22 y.o. female with a history of GERD, IBS, asthma, hypertension who presents for evaluation of abdominal pain, nausea, and diarrhea for 2 days. patient is well-appearing, in no distress, pulse 117, normal blood pressure, afebrile, abdomen is soft with diffuse tenderness mostly in the upper quadrants, no rebound or guarding, negative Murphy sign. Labs showing  normal white count, normal hemoglobin, normal CMP and lipase, UA showing 20 ketones but no evidence of infection, pregnancy negative. Patient with no prior history of C. difficile or no recent antibiotic use. Low suspicion for C. Diff. Presentation concerning for infectious gastroenteritis. Will give IVF, Zofran, toradol. Anticipate discharge.     _________________________ 12:51 PM on 04/15/2017 -----------------------------------------  patient is markedly improved. Tolerating by mouth. Has been in the emergency department for several hours with no further episodes of diarrhea. At this time she is going be discharged home on Zofran, increase oral hydration,  and follow up with primary care doctor. Discussed return precautions for any new or worsening abdominal pain or for signs of dehydration.   As part of my medical decision making, I reviewed the following data within the [facility] notes reviewed and incorporated, Labs reviewed , Notes from prior ED visits and [place] Controlled Substance Database    Pertinent labs & imaging results that were available during my care of the patient were reviewed by me and considered in my medical decision making (see chart for details).    ____________________________________________   FINAL CLINICAL IMPRESSION(S) / ED DIAGNOSES  Final  diagnoses:  Viral gastroenteritis      NEW MEDICATIONS STARTED DURING THIS VISIT:  ED Discharge Orders        Ordered    ondansetron (ZOFRAN ODT) 4 MG disintegrating tablet  Every 8 hours PRN     04/15/17 1251    alum & mag hydroxide-simeth (MAALOX MAX) 493-552-17 MG/5ML suspension  Every 6 hours PRN     04/15/17 1251       Note:  This document was prepared using Dragon voice recognition software and may include unintentional dictation errors.    Alfred Levins, [place], MD 04/15/17 [phone]

## 2017-04-15 NOTE — ED Notes (Signed)
First Nurse Note:  Patient complaining of abdominal pain beginning on Mon. With no improvement. Complaining of nausea.

## 2017-04-15 NOTE — ED Notes (Signed)
Patient ambulatory to lobby with NAD noted. Verbalized understanding of discharge instructions and follow-up care. 

## 2017-05-04 ENCOUNTER — Other Ambulatory Visit: Payer: Self-pay | Admitting: Obstetrics and Gynecology

## 2017-05-04 ENCOUNTER — Telehealth: Payer: Self-pay

## 2017-05-04 MED ORDER — METRONIDAZOLE 500 MG PO TABS: 500.0000 mg | ORAL_TABLET | Freq: Two times a day (BID) | ORAL | 0 refills | Status: AC

## 2017-05-04 NOTE — Progress Notes (Signed)
Rx RF for BV. F/u prn.  

## 2017-05-04 NOTE — Telephone Encounter (Signed)
Rx flagyl eRxd. RN to notify pt.

## 2017-05-04 NOTE — Telephone Encounter (Signed)
Pt calling for [facility]. She states she is still having S&S of the BV. [facility] had told her she would call a different medication in if she was still having issues. CB# [phone]

## 2017-08-03 ENCOUNTER — Ambulatory Visit: Payer: Self-pay | Admitting: Obstetrics and Gynecology

## 2017-08-03 ENCOUNTER — Encounter: Payer: Self-pay | Admitting: Obstetrics and Gynecology

## 2017-08-03 VITALS — BP 118/76 | HR 94 | Ht 63.0 in | Wt 191.0 lb

## 2017-08-03 DIAGNOSIS — N3000 Acute cystitis without hematuria: Secondary | ICD-10-CM

## 2017-08-03 DIAGNOSIS — B373 Candidiasis of vulva and vagina: Secondary | ICD-10-CM

## 2017-08-03 DIAGNOSIS — Z113 Encounter for screening for infections with a predominantly sexual mode of transmission: Secondary | ICD-10-CM

## 2017-08-03 DIAGNOSIS — Z124 Encounter for screening for malignant neoplasm of cervix: Secondary | ICD-10-CM

## 2017-08-03 DIAGNOSIS — Z30431 Encounter for routine checking of intrauterine contraceptive device: Secondary | ICD-10-CM

## 2017-08-03 DIAGNOSIS — B3731 Acute candidiasis of vulva and vagina: Secondary | ICD-10-CM

## 2017-08-03 LAB — POCT WET PREP WITH KOH
Clue Cells Wet Prep HPF POC: NEGATIVE
KOH PREP POC: NEGATIVE
TRICHOMONAS UA: NEGATIVE
Yeast Wet Prep HPF POC: POSITIVE

## 2017-08-03 LAB — POCT URINALYSIS DIPSTICK
Bilirubin, UA: NEGATIVE
Glucose, UA: NEGATIVE
KETONES UA: NEGATIVE
NITRITE UA: NEGATIVE
PH UA: 6 (ref 5.0–8.0)
PROTEIN UA: NEGATIVE
RBC UA: NEGATIVE
Spec Grav, UA: 1.01 (ref 1.010–1.025)

## 2017-08-03 MED ORDER — FLUCONAZOLE 150 MG PO TABS
150.0000 mg | ORAL_TABLET | Freq: Once | ORAL | 0 refills | Status: AC
Start: 2017-08-03 — End: 2017-08-03

## 2017-08-03 MED ORDER — CEFIXIME 400 MG PO CAPS: 400.0000 mg | ORAL_CAPSULE | Freq: Every day | ORAL | 0 refills | Status: DC

## 2017-08-03 NOTE — Progress Notes (Signed)
Steele Sizer, MD   Chief Complaint  Patient presents with  . Vaginitis    Vaginal white clumpy discharge, cramps in lower back, irritation, some itching, pain with urination      HPI:      Ms. Becky Jackson is a 22 y.o. G1P0101 who LMP was No LMP recorded. (Menstrual status: IUD)., presents today for increased vag d/c, itching, irritation, possible odor for 1 day. No recent abx use except BV treated with clindesse 2/19 with sx relief. No meds to treat. Pt has also noticed urinary frequency/dysuria and suprapubic discomfort, LBP for a couple wks.  No fevers/chills.  Pt is sex active with new partner. Had Mirena.  No recent pap.   Pt is self pay.  Past Medical History:  Diagnosis Date  . Acid reflux   . Anxiety   . Asthma   . Asthma   . Chlamydia   . GERD (gastroesophageal reflux disease)   . Hypertension   . Irritable bowel syndrome   . Obesity     Past Surgical History:  Procedure Laterality Date  . CHOLECYSTECTOMY  2014    Family History  Problem Relation Age of Onset  . Hypertension Mother   . Diabetes Mother     Social History   Socioeconomic History  . Marital status: Single    Spouse name: Not on file  . Number of children: Not on file  . Years of education: Not on file  . Highest education level: Not on file  Occupational History  . Not on file  Social Needs  . Financial resource strain: Not on file  . Food insecurity:    Worry: Not on file    Inability: Not on file  . Transportation needs:    Medical: Not on file    Non-medical: Not on file  Tobacco Use  . Smoking status: Never Smoker  . Smokeless tobacco: Never Used  Substance and Sexual Activity  . Alcohol use: No  . Drug use: No  . Sexual activity: Yes    Birth control/protection: IUD    Comment: Mirena   Lifestyle  . Physical activity:    Days per week: Not on file    Minutes per session: Not on file  . Stress: Not on file  Relationships  . Social connections:    Talks on  phone: Not on file    Gets together: Not on file    Attends religious service: Not on file    Active member of club or organization: Not on file    Attends meetings of clubs or organizations: Not on file    Relationship status: Not on file  . Intimate partner violence:    Fear of current or ex partner: Not on file    Emotionally abused: Not on file    Physically abused: Not on file    Forced sexual activity: Not on file  Other Topics Concern  . Not on file  Social History Narrative  . Not on file    Outpatient Medications Prior to Visit  Medication Sig Dispense Refill  . levonorgestrel (MIRENA) 20 MCG/24HR IUD 1 each by Intrauterine route once.    Marland Kitchen albuterol (PROVENTIL HFA;VENTOLIN HFA) 108 (90 BASE) MCG/ACT inhaler Inhale 2 puffs into the lungs every 4 (four) hours as needed for wheezing or shortness of breath. (Patient not taking: Reported on 03/19/2017) 1 Inhaler 0  . alum & mag hydroxide-simeth (MAALOX MAX) 400-400-40 MG/5ML suspension Take 5 mLs by mouth every  6 (six) hours as needed for indigestion. (Patient not taking: Reported on 08/03/2017) 355 mL 0  . atenolol (TENORMIN) 25 MG tablet Take 25 mg by mouth daily.    . brompheniramine-pseudoephedrine-DM 30-2-10 MG/5ML syrup Take 5 mLs by mouth 4 (four) times daily as needed. (Patient not taking: Reported on 03/19/2017) 120 mL 0  . Clindamycin Phosphate, 1 Dose, (CLINDESSE) vaginal cream Insert 1 applicator vaginally one dose, 1 sample given (Patient not taking: Reported on 04/15/2017) 5.8 g 0  . ondansetron (ZOFRAN ODT) 4 MG disintegrating tablet Take 1 tablet (4 mg total) by mouth every 8 (eight) hours as needed for nausea or vomiting. (Patient not taking: Reported on 08/03/2017) 20 tablet 0   No facility-administered medications prior to visit.       ROS:  Review of Systems  Constitutional: Negative for fever.  Gastrointestinal: Negative for blood in stool, constipation, diarrhea, nausea and vomiting.  Genitourinary: Positive  for frequency, urgency and vaginal discharge. Negative for dyspareunia, dysuria, flank pain, hematuria, vaginal bleeding and vaginal pain.  Musculoskeletal: Positive for back pain.  Skin: Negative for rash.   BREAST: No symptoms   OBJECTIVE:   Vitals:  BP 118/76   Pulse 94   Ht 5\' 3"  (1.6 m)   Wt 191 lb (86.6 kg)   BMI 33.83 kg/m   Physical Exam  Constitutional: She is oriented to person, place, and time. Vital signs are normal. She appears well-developed.  Neck: Normal range of motion.  Pulmonary/Chest: Effort normal.  Abdominal: There is no CVA tenderness.  Genitourinary: Uterus normal. There is tenderness on the right labia. There is no rash or lesion on the right labia. There is tenderness on the left labia. There is no rash or lesion on the left labia. Uterus is not enlarged and not tender. Cervix exhibits no motion tenderness. Right adnexum displays no mass and no tenderness. Left adnexum displays no mass and no tenderness. No erythema or tenderness in the vagina. Vaginal discharge found.  Genitourinary Comments: VULVA WITH ERYTHEMA, CLUMPY D/C, IRRITATION; IUD STRINGS IN OS  Musculoskeletal: Normal range of motion.  Neurological: She is alert and oriented to person, place, and time. No cranial nerve deficit.  Psychiatric: She has a normal mood and affect. Her behavior is normal. Judgment and thought content normal.  Vitals reviewed.   Results: Results for orders placed or performed in visit on 08/03/17 (from the past 24 hour(s))  POCT Urinalysis Dipstick     Status: Abnormal   Collection Time: 08/03/17  4:03 PM  Result Value Ref Range   Color, UA STRAW    Clarity, UA CLEAR    Glucose, UA Negative Negative   Bilirubin, UA NEG    Ketones, UA NEG    Spec Grav, UA 1.010 1.010 - 1.025   Blood, UA NEG    pH, UA 6.0 5.0 - 8.0   Protein, UA Negative Negative   Urobilinogen, UA  0.2 or 1.0 E.U./dL   Nitrite, UA NEG    Leukocytes, UA Large (3+) (A) Negative   Appearance       Odor    POCT Wet Prep with KOH     Status: Normal   Collection Time: 08/03/17  4:04 PM  Result Value Ref Range   Trichomonas, UA Negative    Clue Cells Wet Prep HPF POC NEG    Epithelial Wet Prep HPF POC  Few, Moderate, Many, Too numerous to count   Yeast Wet Prep HPF POC POS    Bacteria Wet  Prep HPF POC  Few   RBC Wet Prep HPF POC     WBC Wet Prep HPF POC     KOH Prep POC Negative Negative     Assessment/Plan: Candidal vaginitis - Pos wet prep. Rx diflucan. F/u prn.  - Plan: fluconazole (DIFLUCAN) 150 MG tablet, POCT Wet Prep with KOH  Acute cystitis without hematuria - Pos dip. Check C&S. 3 samples suprax QD for 3 days given to pt. F/u prn.  - Plan: cefixime (SUPRAX) 400 MG CAPS capsule, POCT Urinalysis Dipstick, Urine Culture  Cervical cancer screening - MDL pap since self-pay - Plan: Other/Misc lab test  Screening for STD (sexually transmitted disease) - MDL since self-pay - Plan: Other/Misc lab test  Encounter for routine checking of intrauterine contraceptive device (IUD) - IUD in place.    Meds ordered this encounter  Medications  . fluconazole (DIFLUCAN) 150 MG tablet    Sig: Take 1 tablet (150 mg total) by mouth once for 1 dose.    Dispense:  1 tablet    Refill:  0    Order Specific Question:   Supervising Provider    Answer:   Gae Dry [id number]  . cefixime (SUPRAX) 400 MG CAPS capsule    Sig: Take 1 capsule (400 mg total) by mouth daily. SAMPLES GIVEN SINCE SELF PAY    Dispense:  3 capsule    Refill:  0    Order Specific Question:   Supervising Provider    Answer:   Gae Dry [[id number]]      Return if symptoms worsen or fail to improve.  Alicia B. Copland, PA-C 08/03/2017 4:07 PM

## 2017-08-03 NOTE — Patient Instructions (Signed)
I value your feedback and entrusting us with your care. If you get a [facility] patient survey, I would appreciate you taking the time to let us know about your experience today. Thank you! 

## 2017-08-05 LAB — URINE CULTURE

## 2017-08-15 ENCOUNTER — Emergency Department
Admission: EM | Admit: 2017-08-15 | Discharge: 2017-08-15 | Disposition: A | Discharge status: Home / Self Care | Payer: Self-pay | Attending: Emergency Medicine | Admitting: Emergency Medicine

## 2017-08-15 ENCOUNTER — Encounter: Payer: Self-pay | Admitting: Emergency Medicine

## 2017-08-15 ENCOUNTER — Other Ambulatory Visit: Payer: Self-pay

## 2017-08-15 DIAGNOSIS — Z79899 Other long term (current) drug therapy: Secondary | ICD-10-CM | POA: Insufficient documentation

## 2017-08-15 DIAGNOSIS — R109 Unspecified abdominal pain: Secondary | ICD-10-CM

## 2017-08-15 DIAGNOSIS — R1030 Lower abdominal pain, unspecified: Secondary | ICD-10-CM | POA: Insufficient documentation

## 2017-08-15 DIAGNOSIS — R112 Nausea with vomiting, unspecified: Secondary | ICD-10-CM | POA: Insufficient documentation

## 2017-08-15 DIAGNOSIS — R319 Hematuria, unspecified: Secondary | ICD-10-CM | POA: Insufficient documentation

## 2017-08-15 DIAGNOSIS — R5381 Other malaise: Secondary | ICD-10-CM | POA: Insufficient documentation

## 2017-08-15 DIAGNOSIS — I10 Essential (primary) hypertension: Secondary | ICD-10-CM | POA: Insufficient documentation

## 2017-08-15 DIAGNOSIS — J45909 Unspecified asthma, uncomplicated: Secondary | ICD-10-CM | POA: Insufficient documentation

## 2017-08-15 DIAGNOSIS — R300 Dysuria: Secondary | ICD-10-CM | POA: Insufficient documentation

## 2017-08-15 LAB — POCT PREGNANCY, URINE: Preg Test, Ur: NEGATIVE

## 2017-08-15 LAB — URINALYSIS, COMPLETE (UACMP) WITH MICROSCOPIC
Bilirubin Urine: NEGATIVE
GLUCOSE, UA: NEGATIVE mg/dL
Ketones, ur: NEGATIVE mg/dL
LEUKOCYTES UA: NEGATIVE
Nitrite: NEGATIVE
PH: 6 (ref 5.0–8.0)
PROTEIN: NEGATIVE mg/dL
Specific Gravity, Urine: 1.026 (ref 1.005–1.030)

## 2017-08-15 LAB — COMPREHENSIVE METABOLIC PANEL
ALT: 19 U/L (ref 0–44)
AST: 18 U/L (ref 15–41)
Albumin: 4.3 g/dL (ref 3.5–5.0)
Alkaline Phosphatase: 77 U/L (ref 38–126)
Anion gap: 9 (ref 5–15)
BUN: 16 mg/dL (ref 6–20)
CHLORIDE: 105 mmol/L (ref 98–111)
CO2: 26 mmol/L (ref 22–32)
Calcium: 8.9 mg/dL (ref 8.9–10.3)
Creatinine, Ser: 0.81 mg/dL (ref 0.44–1.00)
GFR calc non Af Amer: >60 mL/min (ref >60)
Glucose, Bld: 98 mg/dL (ref 70–99)
POTASSIUM: 3.9 mmol/L (ref 3.5–5.1)
SODIUM: 140 mmol/L (ref 135–145)
Total Bilirubin: 0.5 mg/dL (ref 0.3–1.2)
Total Protein: 7.3 g/dL (ref 6.5–8.1)

## 2017-08-15 LAB — CHLAMYDIA/NGC RT PCR (ARMC ONLY)
CHLAMYDIA TR: NOT DETECTED
N gonorrhoeae: NOT DETECTED

## 2017-08-15 LAB — CBC
HCT: 41.5 % (ref 35.0–47.0)
Hemoglobin: 14.2 g/dL (ref 12.0–16.0)
MCH: 31.6 pg (ref 26.0–34.0)
MCHC: 34.3 g/dL (ref 32.0–36.0)
MCV: 92.3 fL (ref 80.0–100.0)
PLATELETS: 256 10*3/uL (ref 150–440)
RBC: 4.5 MIL/uL (ref 3.80–5.20)
RDW: 12.4 % (ref 11.5–14.5)
WBC: 14.2 10*3/uL — ABNORMAL HIGH (ref 3.6–11.0)

## 2017-08-15 LAB — WET PREP, GENITAL
CLUE CELLS WET PREP: NONE SEEN
SPERM: NONE SEEN
TRICH WET PREP: NONE SEEN
Yeast Wet Prep HPF POC: NONE SEEN

## 2017-08-15 MED ORDER — ONDANSETRON 4 MG PO TBDP: 4.0000 mg | ORAL_TABLET | Freq: Three times a day (TID) | ORAL | 0 refills | Status: DC | PRN

## 2017-08-15 MED ORDER — IBUPROFEN 600 MG PO TABS
600.0000 mg | ORAL_TABLET | Freq: Once | ORAL | Status: AC
  Administered 2017-08-15: 600 mg via ORAL
  Filled 2017-08-15: qty 1

## 2017-08-15 MED ORDER — CEPHALEXIN 500 MG PO CAPS: 500.0000 mg | ORAL_CAPSULE | Freq: Two times a day (BID) | ORAL | 0 refills | Status: AC

## 2017-08-15 MED ORDER — IBUPROFEN 600 MG PO TABS: 600.0000 mg | ORAL_TABLET | Freq: Four times a day (QID) | ORAL | 0 refills | Status: DC | PRN

## 2017-08-15 NOTE — ED Triage Notes (Signed)
Bilateral flank pain x 2 weeks, states was treated for UTI during this time but symptoms increase.

## 2017-08-15 NOTE — ED Notes (Signed)
Phone report to Jenny Reichmann, Therapist, sports

## 2017-08-15 NOTE — ED Provider Notes (Signed)
[facility] Emergency Department Provider Note ____________________________________________   First MD Initiated Contact with Patient 08/15/17 2116     (approximate)  I have reviewed the triage vital signs and the nursing notes.   HISTORY  Chief Complaint Flank Pain    HPI [name] is a 22 y.o. female with PMH as noted below who presents with dysuria, lower abdominal left flank discomfort, and generalized malaise over the last 2 weeks.  The patient states that she initially had dysuria and lower abdominal discomfort as well as discharge and went to [place], where she was diagnosed with a yeast infection and a UTI.  She was treated with a 3-day course of antibiotic as well as Diflucan.  She reports persistent dysuria and hematuria although she denies discharge or vaginal bleeding.  No fevers.  She reports nausea and one episode of vomiting today.   Past Medical History:  Diagnosis Date  . Acid reflux   . Anxiety   . Asthma   . Asthma   . Chlamydia   . GERD (gastroesophageal reflux disease)   . Hypertension   . Irritable bowel syndrome   . Obesity     Patient Active Problem List   Diagnosis Date Noted  . BV (bacterial vaginosis) 06/16/2016  . Anemia 11/16/2012  . Chronic cholecystitis 10/26/2012  . Abdominal pain, other specified site 10/25/2012    Past Surgical History:  Procedure Laterality Date  . CHOLECYSTECTOMY  2014    Prior to Admission medications   Medication Sig Start Date End Date Taking? Authorizing Provider  albuterol (PROVENTIL HFA;VENTOLIN HFA) 108 (90 BASE) MCG/ACT inhaler Inhale 2 puffs into the lungs every 4 (four) hours as needed for wheezing or shortness of breath. Patient not taking: Reported on 03/19/2017 01/08/15   Paulette Blanch, MD  alum & mag hydroxide-simeth (MAALOX MAX) 400-400-40 MG/5ML suspension Take 5 mLs by mouth every 6 (six) hours as needed for indigestion. Patient not taking: Reported on  08/03/2017 04/15/17   Rudene Re, MD  atenolol (TENORMIN) 25 MG tablet Take 25 mg by mouth daily.    [provider]  brompheniramine-pseudoephedrine-DM 30-2-10 MG/5ML syrup Take 5 mLs by mouth 4 (four) times daily as needed. Patient not taking: Reported on 03/19/2017 02/24/17   Johnn Hai, PA-C  cefixime (SUPRAX) 400 MG CAPS capsule Take 1 capsule (400 mg total) by mouth daily. SAMPLES GIVEN SINCE SELF PAY 06/03/82   Copland, Elmo Putt B, PA-C  cephALEXin (KEFLEX) 500 MG capsule Take 1 capsule (500 mg total) by mouth 2 (two) times daily for 10 days. 08/16/17 08/26/17  Arta Silence, MD  Clindamycin Phosphate, 1 Dose, (CLINDESSE) vaginal cream Insert 1 applicator vaginally one dose, 1 sample given Patient not taking: Reported on 04/15/2017 6/96/29   Copland, Deirdre Evener, PA-C  ibuprofen (ADVIL,MOTRIN) 600 MG tablet Take 1 tablet (600 mg total) by mouth every 6 (six) hours as needed. 08/15/17   Arta Silence, MD  levonorgestrel (MIRENA) 20 MCG/24HR IUD 1 each by Intrauterine route once.    [provider]  ondansetron (ZOFRAN ODT) 4 MG disintegrating tablet Take 1 tablet (4 mg total) by mouth every 8 (eight) hours as needed for nausea or vomiting. 08/15/17   Arta Silence, MD    Allergies Augmentin [amoxicillin-pot clavulanate] and Shellfish allergy  Family History  Problem Relation Age of Onset  . Hypertension Mother   . Diabetes Mother     Social History Social History   Tobacco Use  . Smoking status:  Never Smoker  . Smokeless tobacco: Never Used  Substance Use Topics  . Alcohol use: No  . Drug use: No    Review of Systems  Constitutional: No fever.  Positive for generalized malaise. Eyes: No redness. ENT: No sore throat. Cardiovascular: Denies chest pain. Respiratory: Denies shortness of breath. Gastrointestinal: Positive for nausea. Genitourinary: Positive for dysuria.  Musculoskeletal: Negative for back pain. Skin: Negative for  rash. Neurological: Negative for headache.   ____________________________________________   PHYSICAL EXAM:  VITAL SIGNS: ED Triage Vitals  Enc Vitals Group     BP 08/15/17 1848 107/64     Pulse Rate 08/15/17 1848 88     Resp 08/15/17 1848 18     Temp 08/15/17 1848 98.4 F (36.9 C)     Temp Source 08/15/17 1848 Oral     SpO2 08/15/17 1848 99 %     Weight 08/15/17 1849 190 lb (86.2 kg)     Height 08/15/17 1849 5\' 3"  (1.6 m)     Head Circumference --      Peak Flow --      Pain Score 08/15/17 1849 7     Pain Loc --      Pain Edu? --      Excl. in [place]? --     Constitutional: Alert and oriented. Well appearing and in no acute distress. Eyes: Conjunctivae are normal.  Head: Atraumatic. Nose: No congestion/rhinnorhea. Mouth/Throat: Mucous membranes are moist.   Neck: Normal range of motion.  Cardiovascular:  Good peripheral circulation. Respiratory: Normal respiratory effort.  No retractions.  Gastrointestinal: Soft with mild epigastric discomfort but no focal tenderness. No distention.  Genitourinary: No flank tenderness.  Normal-appearing external genitalia.  Trace physiologic appearing vaginal discharge.  No CMT or adnexal tenderness. Musculoskeletal: No lower extremity edema.  Extremities warm and well perfused.  Neurologic:  Normal speech and language. No gross focal neurologic deficits are appreciated.  Skin:  Skin is warm and dry. No rash noted. Psychiatric: Mood and affect are normal. Speech and behavior are normal.  ____________________________________________   LABS (all labs ordered are listed, but only abnormal results are displayed)  Labs Reviewed  WET PREP, GENITAL - Abnormal; Notable for the following components:      Result Value   WBC, Wet Prep HPF POC FEW (*)    All other components within normal limits  CBC - Abnormal; Notable for the following components:   WBC 14.2 (*)    All other components within normal limits  URINALYSIS, COMPLETE (UACMP) WITH  MICROSCOPIC - Abnormal; Notable for the following components:   Color, Urine YELLOW (*)    APPearance CLEAR (*)    Hgb urine dipstick MODERATE (*)    Bacteria, UA RARE (*)    All other components within normal limits  CHLAMYDIA/NGC RT PCR ([facility] ONLY)  COMPREHENSIVE METABOLIC PANEL  POC URINE PREG, ED  POCT PREGNANCY, URINE   ____________________________________________  EKG   ____________________________________________  RADIOLOGY    ____________________________________________   PROCEDURES  Procedure(s) performed: No  Procedures  Critical Care performed: No ____________________________________________   INITIAL IMPRESSION / ASSESSMENT AND PLAN / ED COURSE  Pertinent labs & imaging results that were available during my care of the patient were reviewed by me and considered in my medical decision making (see chart for details).  22 year old female with PMH as noted above presents with persistent dysuria, some abdominal discomfort and left flank pain, and generalized malaise over the last 2 weeks since she was treated for UTI and  yeast infection.  I reviewed the past medical records in epic and confirmed that the patient was seen on 08/03/2017 and treated with Diflucan as well as a 3-day course of cefixime for UTI.  On exam, the patient is well-appearing, the vital signs are normal, and the abdomen is soft with no focal tenderness, and the pelvic exam is unremarkable.  Initial labs reveal elevated WBC, but no significant UA findings and normal chemistry.  Differential includes persistent UTI, BV or other pelvic infection, or gastritis or other unrelated cause.  The exam is not consistent with PID, ovarian torsion, or other concerning acute cause given the patient's overall well appearance, benign exam, and the relatively mild nature of the pain.  No indication for imaging based on patient's current exam.  I will obtain a repeat wet prep and GC/CT probe, and  reassess.  ----------------------------------------- 11:08 PM on 08/15/2017 -----------------------------------------  Patient's wet prep shows no significant findings except for WBCs.  The GC/CT is in process, however the patient had a negative GC/CT when seen by her OB/GYN and has no risk factors.  Her exam also was not consistent with PID.  Therefore, we will not wait for this to disposition the patient.  Although the UA was relatively unremarkable, given the patient's persistent symptoms consistent with a UTI as well as the elevated WBC count the fact that she was treated for a UTI with such a short course of antibiotics, I feel that the most likely cause of patient's symptoms is persistent UTI and I will treat with a longer course of Keflex.  The patient agrees with this plan.  She feels comfortable to go home.  Return precautions given, and she expresses understanding.  She agrees to follow-up at [place].  ____________________________________________   FINAL CLINICAL IMPRESSION(S) / ED DIAGNOSES  Final diagnoses:  Flank pain      NEW MEDICATIONS STARTED DURING THIS VISIT:  New Prescriptions   CEPHALEXIN (KEFLEX) 500 MG CAPSULE    Take 1 capsule (500 mg total) by mouth 2 (two) times daily for 10 days.   IBUPROFEN (ADVIL,MOTRIN) 600 MG TABLET    Take 1 tablet (600 mg total) by mouth every 6 (six) hours as needed.   ONDANSETRON (ZOFRAN ODT) 4 MG DISINTEGRATING TABLET    Take 1 tablet (4 mg total) by mouth every 8 (eight) hours as needed for nausea or vomiting.     Note:  This document was prepared using Dragon voice recognition software and may include unintentional dictation errors.    Arta Silence, MD 08/15/17 2311

## 2017-08-15 NOTE — Discharge Instructions (Addendum)
Take the antibiotic as prescribed and finish the full course.  You may take the ibuprofen as needed for pain, and the Zofran as needed for nausea.  Make an appointment to follow-up with the OB/GYN in 1 to 2 weeks.  Return to the ER for new, worsening, persistent severe pain, fevers, vomiting, weakness, or any other new or worsening symptoms that concern you.

## 2017-08-24 ENCOUNTER — Telehealth: Payer: Self-pay | Admitting: Obstetrics and Gynecology

## 2017-08-24 NOTE — Telephone Encounter (Signed)
Called pt and she is stating her vagina is also burning and tingling just like her tongue is, wanting to know if this is a normal reaction and if she should continue or needs to try a different one

## 2017-08-24 NOTE — Telephone Encounter (Signed)
Pls let pt know tongue tingling could be related to abx she was given in ED. Not going to be related to treatment I gave her early 7/19. If no other allergic sx, just finish abx. Should then go away.

## 2017-08-24 NOTE — Telephone Encounter (Signed)
May have yeast vag due to abx use. Can use OTC monistat for treatment. F/u prn.

## 2017-08-24 NOTE — Telephone Encounter (Signed)
Patient seen [facility] on 7/8 for poss yeast infection or UTI.  She was also seen in ER and was given an antibiotic.  She is now having tingling in her tongue.  Wants to talk to you.

## 2017-08-24 NOTE — Telephone Encounter (Signed)
Please advise 

## 2017-08-24 NOTE — Telephone Encounter (Signed)
Pt aware and will try OTC monistat

## 2017-11-18 ENCOUNTER — Ambulatory Visit (INDEPENDENT_AMBULATORY_CARE_PROVIDER_SITE_OTHER): Payer: Self-pay | Admitting: Obstetrics and Gynecology

## 2017-11-18 ENCOUNTER — Encounter: Payer: Self-pay | Admitting: Obstetrics and Gynecology

## 2017-11-18 VITALS — BP 118/70 | HR 88 | Ht 62.0 in | Wt 192.0 lb

## 2017-11-18 DIAGNOSIS — Z113 Encounter for screening for infections with a predominantly sexual mode of transmission: Secondary | ICD-10-CM

## 2017-11-18 DIAGNOSIS — B373 Candidiasis of vulva and vagina: Secondary | ICD-10-CM

## 2017-11-18 DIAGNOSIS — N760 Acute vaginitis: Secondary | ICD-10-CM

## 2017-11-18 DIAGNOSIS — B3731 Acute candidiasis of vulva and vagina: Secondary | ICD-10-CM

## 2017-11-18 DIAGNOSIS — B9689 Other specified bacterial agents as the cause of diseases classified elsewhere: Secondary | ICD-10-CM

## 2017-11-18 LAB — POCT WET PREP WITH KOH
CLUE CELLS WET PREP PER HPF POC: POSITIVE
KOH Prep POC: POSITIVE — AB
Trichomonas, UA: NEGATIVE

## 2017-11-18 MED ORDER — METRONIDAZOLE 1.3 % VA GEL: 1.0000 | Freq: Once | VAGINAL | 0 refills | Status: AC

## 2017-11-18 MED ORDER — FLUCONAZOLE 150 MG PO TABS: 150.0000 mg | ORAL_TABLET | Freq: Once | ORAL | 0 refills | Status: AC

## 2017-11-18 NOTE — Progress Notes (Signed)
Steele Sizer, MD   Chief Complaint  Patient presents with  . Vaginitis    thick discharge, odor, itchiness, back is crampy x a week and a half    HPI:      Ms. Becky Jackson is a 22 y.o. G1P0101 who LMP was No LMP recorded. (Menstrual status: IUD)., presents today for increased d/c, itching, odor for the past 1 1/2 wks. Pt noted sx started after soap change. Treated with monistat-3 with temp relief. Pt also having mild pelvic discomfort and LBP. No urin sx. Has IBS-D. No fevers. She is sex active, has IUD, not using condoms. Not sure if partner faithful so wants STD testing.  Pt treated for BV with clindesse sample 2/19 with relief and diflucan 7/19 for yeast with relief. Pt using dove sens skin soap now, no dryer sheets. Isn't taking probiotics.  Pt is self pay  Past Medical History:  Diagnosis Date  . Acid reflux   . Anxiety   . Asthma   . Asthma   . Chlamydia   . GERD (gastroesophageal reflux disease)   . Hypertension   . Irritable bowel syndrome   . Obesity     Past Surgical History:  Procedure Laterality Date  . CHOLECYSTECTOMY  2014    Family History  Problem Relation Age of Onset  . Hypertension Mother   . Diabetes Mother        DM Type 2    Social History   Socioeconomic History  . Marital status: Single    Spouse name: Not on file  . Number of children: Not on file  . Years of education: Not on file  . Highest education level: Not on file  Occupational History  . Not on file  Social Needs  . Financial resource strain: Not on file  . Food insecurity:    Worry: Not on file    Inability: Not on file  . Transportation needs:    Medical: Not on file    Non-medical: Not on file  Tobacco Use  . Smoking status: Never Smoker  . Smokeless tobacco: Never Used  Substance and Sexual Activity  . Alcohol use: No  . Drug use: No  . Sexual activity: Yes    Birth control/protection: IUD    Comment: Mirena   Lifestyle  . Physical activity:    Days  per week: Not on file    Minutes per session: Not on file  . Stress: Not on file  Relationships  . Social connections:    Talks on phone: Not on file    Gets together: Not on file    Attends religious service: Not on file    Active member of club or organization: Not on file    Attends meetings of clubs or organizations: Not on file    Relationship status: Not on file  . Intimate partner violence:    Fear of current or ex partner: Not on file    Emotionally abused: Not on file    Physically abused: Not on file    Forced sexual activity: Not on file  Other Topics Concern  . Not on file  Social History Narrative  . Not on file    Outpatient Medications Prior to Visit  Medication Sig Dispense Refill  . albuterol (PROVENTIL HFA;VENTOLIN HFA) 108 (90 BASE) MCG/ACT inhaler Inhale 2 puffs into the lungs every 4 (four) hours as needed for wheezing or shortness of breath. 1 Inhaler 0  . levonorgestrel (MIRENA)  20 MCG/24HR IUD 1 each by Intrauterine route once.    . ondansetron (ZOFRAN ODT) 4 MG disintegrating tablet Take 1 tablet (4 mg total) by mouth every 8 (eight) hours as needed for nausea or vomiting. 12 tablet 0  . atenolol (TENORMIN) 25 MG tablet Take 25 mg by mouth daily.    Marland Kitchen alum & mag hydroxide-simeth (MAALOX MAX) 400-400-40 MG/5ML suspension Take 5 mLs by mouth every 6 (six) hours as needed for indigestion. (Patient not taking: Reported on 08/03/2017) 355 mL 0  . brompheniramine-pseudoephedrine-DM 30-2-10 MG/5ML syrup Take 5 mLs by mouth 4 (four) times daily as needed. (Patient not taking: Reported on 03/19/2017) 120 mL 0  . cefixime (SUPRAX) 400 MG CAPS capsule Take 1 capsule (400 mg total) by mouth daily. SAMPLES GIVEN SINCE SELF PAY 3 capsule 0  . Clindamycin Phosphate, 1 Dose, (CLINDESSE) vaginal cream Insert 1 applicator vaginally one dose, 1 sample given (Patient not taking: Reported on 04/15/2017) 5.8 g 0  . ibuprofen (ADVIL,MOTRIN) 600 MG tablet Take 1 tablet (600 mg total) by  mouth every 6 (six) hours as needed. 30 tablet 0   No facility-administered medications prior to visit.     ROS:  Review of Systems  Constitutional: Negative for fever.  Gastrointestinal: Positive for diarrhea. Negative for blood in stool, constipation, nausea and vomiting.  Genitourinary: Positive for vaginal discharge. Negative for dyspareunia, dysuria, flank pain, frequency, hematuria, urgency, vaginal bleeding and vaginal pain.  Musculoskeletal: Negative for back pain.  Skin: Negative for rash.    OBJECTIVE:   Vitals:  BP 118/70   Pulse 88   Ht 5\' 2"  (1.575 m)   Wt 192 lb (87.1 kg)   BMI 35.12 kg/m   Physical Exam  Constitutional: She is oriented to person, place, and time. Vital signs are normal. She appears well-developed.  Pulmonary/Chest: Effort normal.  Genitourinary: Uterus normal. There is no rash, tenderness or lesion on the right labia. There is no rash, tenderness or lesion on the left labia. Uterus is not enlarged and not tender. Cervix exhibits no motion tenderness. Right adnexum displays no mass and no tenderness. Left adnexum displays no mass and no tenderness. There is erythema in the vagina. No tenderness in the vagina. Vaginal discharge found.  Musculoskeletal: Normal range of motion.  Neurological: She is alert and oriented to person, place, and time.  Psychiatric: She has a normal mood and affect. Her behavior is normal. Thought content normal.  Vitals reviewed.   Results: Results for orders placed or performed in visit on 11/18/17 (from the past 24 hour(s))  POCT Wet Prep with KOH     Status: Abnormal   Collection Time: 11/18/17  9:20 AM  Result Value Ref Range   Trichomonas, UA Negative    Clue Cells Wet Prep HPF POC pos    Epithelial Wet Prep HPF POC     Yeast Wet Prep HPF POC few    Bacteria Wet Prep HPF POC     RBC Wet Prep HPF POC     WBC Wet Prep HPF POC     KOH Prep POC Positive (A) Negative     Assessment/Plan: Bacterial vaginosis -  Pos wet prep. 1 sample nuvessa. Add probiotics/boric acid supp. Use condoms. F/u prn.  - Plan: POCT Wet Prep with KOH, metroNIDAZOLE (NUVESSA) 1.3 % GEL  Candidal vaginitis - Pos wet prep. Rx diflucan. Probiotics/boric acid supp. - Plan: fluconazole (DIFLUCAN) 150 MG tablet, POCT Wet Prep with KOH  Screening for STD (sexually transmitted  disease) - MDL since self-pay. Will call if pos. - Plan: Other/Misc lab test    Meds ordered this encounter  Medications  . fluconazole (DIFLUCAN) 150 MG tablet    Sig: Take 1 tablet (150 mg total) by mouth once for 1 dose.    Dispense:  1 tablet    Refill:  0    Order Specific Question:   Supervising Provider    Answer:   Gae Dry [id number]  . metroNIDAZOLE (NUVESSA) 1.3 % GEL    Sig: Place 1 Tube vaginally once for 1 dose. 1 sample given to pt since self pay    Dispense:  1 Tube    Refill:  0    Order Specific Question:   Supervising Provider    Answer:   Gae Dry [[id number]      Return if symptoms worsen or fail to improve.  Becky Rief B. Haddie Bruhl, PA-C 11/18/2017 9:22 AM

## 2017-11-18 NOTE — Patient Instructions (Signed)
I value your feedback and entrusting us with your care. If you get a [facility] patient survey, I would appreciate you taking the time to let us know about your experience today. Thank you! 

## 2017-11-24 ENCOUNTER — Telehealth: Payer: Self-pay | Admitting: Obstetrics and Gynecology

## 2017-11-24 ENCOUNTER — Telehealth: Payer: Self-pay

## 2017-11-24 DIAGNOSIS — A749 Chlamydial infection, unspecified: Secondary | ICD-10-CM

## 2017-11-24 MED ORDER — AZITHROMYCIN 500 MG PO TABS: 1000.0000 mg | ORAL_TABLET | Freq: Once | ORAL | 0 refills | Status: AC

## 2017-11-24 NOTE — Telephone Encounter (Signed)
Done

## 2017-11-24 NOTE — Telephone Encounter (Signed)
Patient is calling for lab results. Please advise.

## 2017-11-24 NOTE — Telephone Encounter (Signed)
Pt aware of chlamydia on MDL culture (self-pay). Rx azithro. Partner needs tx. Condoms. RTO in 4 wks for TOC. RN to notify [facility].

## 2017-11-24 NOTE — Telephone Encounter (Signed)
Pt returning [facility] call

## 2017-12-16 ENCOUNTER — Emergency Department
Admission: EM | Admit: 2017-12-16 | Discharge: 2017-12-16 | Disposition: A | Discharge status: Home / Self Care | Payer: Self-pay | Attending: Emergency Medicine | Admitting: Emergency Medicine

## 2017-12-16 ENCOUNTER — Other Ambulatory Visit: Payer: Self-pay

## 2017-12-16 DIAGNOSIS — Z79899 Other long term (current) drug therapy: Secondary | ICD-10-CM | POA: Insufficient documentation

## 2017-12-16 DIAGNOSIS — J40 Bronchitis, not specified as acute or chronic: Secondary | ICD-10-CM | POA: Insufficient documentation

## 2017-12-16 DIAGNOSIS — I10 Essential (primary) hypertension: Secondary | ICD-10-CM | POA: Insufficient documentation

## 2017-12-16 MED ORDER — IPRATROPIUM-ALBUTEROL 0.5-2.5 (3) MG/3ML IN SOLN
3.0000 mL | Freq: Once | RESPIRATORY_TRACT | Status: AC
  Administered 2017-12-16: 3 mL via RESPIRATORY_TRACT
  Filled 2017-12-16: qty 3

## 2017-12-16 MED ORDER — AZITHROMYCIN 250 MG PO TABS: ORAL_TABLET | ORAL | 0 refills | Status: DC

## 2017-12-16 MED ORDER — PREDNISONE 10 MG (21) PO TBPK: ORAL_TABLET | ORAL | 0 refills | Status: DC

## 2017-12-16 MED ORDER — ALBUTEROL SULFATE HFA 108 (90 BASE) MCG/ACT IN AERS: 2.0000 | INHALATION_SPRAY | RESPIRATORY_TRACT | 1 refills | Status: AC | PRN

## 2017-12-16 NOTE — Discharge Instructions (Signed)
Please follow up with your primary care provider if not improving over the next few days.  Return to the ER for symptoms that change or worsen if unable to schedule an appointment.

## 2017-12-16 NOTE — ED Triage Notes (Addendum)
Pt arrives to ED via POV from home with c/o cough and dental pain x "a week and a half". Pt reports upper right toothache, OTC medications taken without relief. Pt also c/o productive cough with "dark-colored" sputum x 1.5 weeks. Pt denies N/V/D or fever. Pt is A&O, in NAD; RR even, regular and unlabored.

## 2017-12-17 NOTE — ED Provider Notes (Signed)
[facility] Emergency Department Provider Note  ____________________________________________  Time seen: Approximately 8:49 PM  I have reviewed the triage vital signs and the nursing notes.   HISTORY  Chief Complaint Cough and Dental Pain   HPI Becky Jackson is a 22 y.o. female presenting to the emergency department for treatment and evaluation of cough.  Symptoms started about 10 days ago.  She states that she has taken over-the-counter medications without any relief.  She states that the congestion in her face is making her feel like she has a right side toothache, but denies having dental pain prior to sinus congestion.  She denies nausea, vomiting, diarrhea, or fever.    Past Medical History:  Diagnosis Date  . Acid reflux   . Anxiety   . Asthma   . Asthma   . Chlamydia   . GERD (gastroesophageal reflux disease)   . Hypertension   . Irritable bowel syndrome   . Obesity     Patient Active Problem List   Diagnosis Date Noted  . BV (bacterial vaginosis) 06/16/2016  . Anemia 11/16/2012  . Chronic cholecystitis 10/26/2012  . Abdominal pain, other specified site 10/25/2012    Past Surgical History:  Procedure Laterality Date  . CHOLECYSTECTOMY  2014    Prior to Admission medications   Medication Sig Start Date End Date Taking? Authorizing Provider  albuterol (PROVENTIL HFA;VENTOLIN HFA) 108 (90 Base) MCG/ACT inhaler Inhale 2 puffs into the lungs every 4 (four) hours as needed for wheezing or shortness of breath. 12/16/17   Levis Nazir B, FNP  atenolol (TENORMIN) 25 MG tablet Take 25 mg by mouth daily.    [provider]  azithromycin (ZITHROMAX) 250 MG tablet 2 tablets today, then 1 tablet for the next 4 days. 12/16/17   Zianna Dercole, Dessa Phi, FNP  levonorgestrel (MIRENA) 20 MCG/24HR IUD 1 each by Intrauterine route once.    [provider]  ondansetron (ZOFRAN ODT) 4 MG disintegrating tablet Take 1 tablet (4 mg total) by mouth  every 8 (eight) hours as needed for nausea or vomiting. 08/15/17   Arta Silence, MD  predniSONE (STERAPRED UNI-PAK 21 TAB) 10 MG (21) TBPK tablet Take 6 tablets on the first day and decrease by 1 tablet each day until finished. 12/16/17   Ty Oshima, Johnette Abraham B, FNP    Allergies Augmentin [amoxicillin-pot clavulanate] and Shellfish allergy  Family History  Problem Relation Age of Onset  . Hypertension Mother   . Diabetes Mother        DM Type 2    Social History Social History   Tobacco Use  . Smoking status: Never Smoker  . Smokeless tobacco: Never Used  Substance Use Topics  . Alcohol use: No  . Drug use: No    Review of Systems Constitutional: Negative for fever/chills.  Negative for appetite. ENT: No sore throat. Cardiovascular: Denies chest pain. Respiratory: Negative for shortness of breath.  Positive for cough.  Positive for wheezing.  Gastrointestinal: No nausea, no vomiting.  No diarrhea.  Musculoskeletal: Negative for body aches Skin: Negative for rash. Neurological: Positive for headaches ____________________________________________   PHYSICAL EXAM:  VITAL SIGNS: ED Triage Vitals  Enc Vitals Group     BP 12/16/17 1933 114/78     Pulse Rate 12/16/17 1933 87     Resp 12/16/17 1933 17     Temp 12/16/17 1933 98.6 F (37 C)     Temp Source 12/16/17 1933 Oral     SpO2 12/16/17 1933 99 %  Weight 12/16/17 1934 190 lb (86.2 kg)     Height 12/16/17 1934 5\' 3"  (1.6 m)     Head Circumference --      Peak Flow --      Pain Score 12/16/17 1933 7     Pain Loc --      Pain Edu? --      Excl. in [place]? --     Constitutional: Alert and oriented.  Acutely ill appearing and in no acute distress. Eyes: Conjunctivae are normal. Ears: Bilateral tympanic membranes are injected but otherwise normal Nose: Maxillary sinus congestion noted; no rhinnorhea. Mouth/Throat: Mucous membranes are moist.  Oropharynx normal. Tonsils flat. Uvula midline. Neck: No stridor.   Lymphatic: No cervical lymphadenopathy. Cardiovascular: Normal rate, regular rhythm. Good peripheral circulation. Respiratory: Respirations are even and unlabored.  No retractions.  Faint expiratory wheezes noted in bilateral bases otherwise clear.. Gastrointestinal: Soft and nontender.  Musculoskeletal: FROM x 4 extremities.  Neurologic:  Normal speech and language. Skin:  Skin is warm, dry and intact. No rash noted. Psychiatric: Mood and affect are normal. Speech and behavior are normal.  ____________________________________________   LABS (all labs ordered are listed, but only abnormal results are displayed)  Labs Reviewed - No data to display ____________________________________________  EKG  Not indicated ____________________________________________  RADIOLOGY  Not indicated ____________________________________________   PROCEDURES  Procedure(s) performed: None  Critical Care performed: No ____________________________________________   INITIAL IMPRESSION / ASSESSMENT AND PLAN / ED COURSE  22 y.o. female presenting to the emergency department for treatment and evaluation of 10 days of cough and sinus congestion.  While here, patient was given a DuoNeb treatment with significant reduction in wheezing.  She will be treated with azithromycin, prednisone, and albuterol.  She was advised to follow-up with her primary care provider if symptoms are not improving over the week.  She was told to return to the emergency department for symptoms of change or worsen if she is unable to schedule appointment.    Medications  ipratropium-albuterol (DUONEB) 0.5-2.5 (3) MG/3ML nebulizer solution 3 mL (3 mLs Nebulization Given 12/16/17 2035)    ED Discharge Orders         Ordered    azithromycin (ZITHROMAX) 250 MG tablet     12/16/17 2034    predniSONE (STERAPRED UNI-PAK 21 TAB) 10 MG (21) TBPK tablet     12/16/17 2034    albuterol (PROVENTIL HFA;VENTOLIN HFA) 108 (90 Base)  MCG/ACT inhaler  Every 4 hours PRN     12/16/17 2034           Pertinent labs & imaging results that were available during my care of the patient were reviewed by me and considered in my medical decision making (see chart for details).    If controlled substance prescribed during this visit, 12 month history viewed on the [place] prior to issuing an initial prescription for Schedule II or III opiod. ____________________________________________   FINAL CLINICAL IMPRESSION(S) / ED DIAGNOSES  Final diagnoses:  Bronchitis    Note:  This document was prepared using Dragon voice recognition software and may include unintentional dictation errors.     Victorino Dike, FNP 12/17/17 0009    Orbie Pyo, MD 12/21/17 [phone]

## 2018-03-21 DIAGNOSIS — H9202 Otalgia, left ear: Secondary | ICD-10-CM | POA: Diagnosis not present

## 2018-04-20 ENCOUNTER — Emergency Department
Admission: EM | Admit: 2018-04-20 | Discharge: 2018-04-20 | Disposition: A | Discharge status: Home / Self Care | Payer: Self-pay | Attending: Emergency Medicine | Admitting: Emergency Medicine

## 2018-04-20 ENCOUNTER — Other Ambulatory Visit: Payer: Self-pay

## 2018-04-20 ENCOUNTER — Emergency Department: Payer: Self-pay

## 2018-04-20 ENCOUNTER — Encounter: Payer: Self-pay | Admitting: *Deleted

## 2018-04-20 DIAGNOSIS — Z79899 Other long term (current) drug therapy: Secondary | ICD-10-CM | POA: Insufficient documentation

## 2018-04-20 DIAGNOSIS — R52 Pain, unspecified: Secondary | ICD-10-CM | POA: Diagnosis not present

## 2018-04-20 DIAGNOSIS — J45909 Unspecified asthma, uncomplicated: Secondary | ICD-10-CM | POA: Insufficient documentation

## 2018-04-20 DIAGNOSIS — R000 Tachycardia, unspecified: Secondary | ICD-10-CM | POA: Diagnosis not present

## 2018-04-20 DIAGNOSIS — I10 Essential (primary) hypertension: Secondary | ICD-10-CM | POA: Insufficient documentation

## 2018-04-20 DIAGNOSIS — R112 Nausea with vomiting, unspecified: Secondary | ICD-10-CM | POA: Insufficient documentation

## 2018-04-20 DIAGNOSIS — R1084 Generalized abdominal pain: Secondary | ICD-10-CM | POA: Diagnosis not present

## 2018-04-20 DIAGNOSIS — R109 Unspecified abdominal pain: Secondary | ICD-10-CM

## 2018-04-20 LAB — COMPREHENSIVE METABOLIC PANEL
ALT: 25 U/L (ref 0–44)
AST: 25 U/L (ref 15–41)
Albumin: 5 g/dL (ref 3.5–5.0)
Alkaline Phosphatase: 77 U/L (ref 38–126)
Anion gap: 14 (ref 5–15)
BILIRUBIN TOTAL: 0.9 mg/dL (ref 0.3–1.2)
BUN: 18 mg/dL (ref 6–20)
CHLORIDE: 106 mmol/L (ref 98–111)
CO2: 17 mmol/L — ABNORMAL LOW (ref 22–32)
CREATININE: 0.75 mg/dL (ref 0.44–1.00)
Calcium: 9.4 mg/dL (ref 8.9–10.3)
GFR calc Af Amer: >60 mL/min (ref >60)
GLUCOSE: 127 mg/dL — AB (ref 70–99)
Potassium: 4 mmol/L (ref 3.5–5.1)
Sodium: 137 mmol/L (ref 135–145)
Total Protein: 7.7 g/dL (ref 6.5–8.1)

## 2018-04-20 LAB — URINALYSIS, COMPLETE (UACMP) WITH MICROSCOPIC
BACTERIA UA: NONE SEEN
Bilirubin Urine: NEGATIVE
Glucose, UA: NEGATIVE mg/dL
Ketones, ur: 5 mg/dL — AB
Nitrite: NEGATIVE
PH: 5 (ref 5.0–8.0)
PROTEIN: NEGATIVE mg/dL
Specific Gravity, Urine: 1.023 (ref 1.005–1.030)

## 2018-04-20 LAB — CBC
HEMATOCRIT: 48.3 % — AB (ref 36.0–46.0)
Hemoglobin: 16.5 g/dL — ABNORMAL HIGH (ref 12.0–15.0)
MCH: 30.9 pg (ref 26.0–34.0)
MCHC: 34.2 g/dL (ref 30.0–36.0)
MCV: 90.4 fL (ref 80.0–100.0)
NRBC: 0 % (ref 0.0–0.2)
PLATELETS: 261 10*3/uL (ref 150–400)
RBC: 5.34 MIL/uL — AB (ref 3.87–5.11)
RDW: 11.8 % (ref 11.5–15.5)
WBC: 17.5 10*3/uL — ABNORMAL HIGH (ref 4.0–10.5)

## 2018-04-20 LAB — POCT PREGNANCY, URINE: PREG TEST UR: NEGATIVE

## 2018-04-20 LAB — TROPONIN I

## 2018-04-20 LAB — LIPASE, BLOOD: LIPASE: 24 U/L (ref 11–51)

## 2018-04-20 MED ORDER — IOHEXOL 240 MG/ML SOLN
50.0000 mL | Freq: Once | INTRAMUSCULAR | Status: AC | PRN
Start: 2018-04-20 — End: 2018-04-20
  Administered 2018-04-20: 30 mL via ORAL

## 2018-04-20 MED ORDER — ALUM & MAG HYDROXIDE-SIMETH 200-200-20 MG/5ML PO SUSP
30.0000 mL | Freq: Once | ORAL | Status: AC
  Administered 2018-04-20: 30 mL via ORAL
  Filled 2018-04-20: qty 30

## 2018-04-20 MED ORDER — ONDANSETRON HCL 4 MG/2ML IJ SOLN
4.0000 mg | Freq: Once | INTRAMUSCULAR | Status: AC
  Administered 2018-04-20: 4 mg via INTRAVENOUS
  Filled 2018-04-20: qty 2

## 2018-04-20 MED ORDER — MORPHINE SULFATE (PF) 4 MG/ML IV SOLN
4.0000 mg | Freq: Once | INTRAVENOUS | Status: AC
  Administered 2018-04-20: 4 mg via INTRAVENOUS
  Filled 2018-04-20: qty 1

## 2018-04-20 MED ORDER — LIDOCAINE VISCOUS HCL 2 % MT SOLN
15.0000 mL | Freq: Once | OROMUCOSAL | Status: AC
  Administered 2018-04-20: 15 mL via ORAL
  Filled 2018-04-20: qty 15

## 2018-04-20 MED ORDER — PROMETHAZINE HCL 25 MG/ML IJ SOLN
25.0000 mg | Freq: Once | INTRAMUSCULAR | Status: AC
  Administered 2018-04-20: 25 mg via INTRAVENOUS
  Filled 2018-04-20: qty 1

## 2018-04-20 MED ORDER — METOCLOPRAMIDE HCL 5 MG/ML IJ SOLN
10.0000 mg | Freq: Once | INTRAMUSCULAR | Status: AC
  Administered 2018-04-20: 10 mg via INTRAVENOUS
  Filled 2018-04-20: qty 2

## 2018-04-20 MED ORDER — METOCLOPRAMIDE HCL 10 MG PO TABS: 10.0000 mg | ORAL_TABLET | Freq: Four times a day (QID) | ORAL | 0 refills | Status: DC | PRN

## 2018-04-20 MED ORDER — SODIUM CHLORIDE 0.9 % IV BOLUS
1000.0000 mL | Freq: Once | INTRAVENOUS | Status: AC
  Administered 2018-04-20: 1000 mL via INTRAVENOUS

## 2018-04-20 MED ORDER — HYDROMORPHONE HCL 1 MG/ML IJ SOLN
1.0000 mg | Freq: Once | INTRAMUSCULAR | Status: AC
  Administered 2018-04-20: 1 mg via INTRAVENOUS
  Filled 2018-04-20: qty 1

## 2018-04-20 MED ORDER — IOHEXOL 300 MG/ML  SOLN
100.0000 mL | Freq: Once | INTRAMUSCULAR | Status: AC | PRN
  Administered 2018-04-20: 100 mL via INTRAVENOUS

## 2018-04-20 NOTE — ED Notes (Signed)
Patient had large, food emesis. Dr. Kerman Passey aware.

## 2018-04-20 NOTE — ED Provider Notes (Signed)
[facility] Emergency Department Provider Note  Time seen: 5:11 PM  I have reviewed the triage vital signs and the nursing notes.   HISTORY  Chief Complaint Abdominal Pain   HPI Becky Jackson is a 23 y.o. female with a past medical history of gastric reflux, anxiety, asthma, hypertension, presents to the emergency department for left-sided abdominal pain.  According to the patient since last night she developed left-sided abdominal pain along with nausea vomiting diarrhea.  Patient states the pain is a 10/10 in severity.  Patient states she has had abdominal pain similar before, but this is worse.  Denies any vaginal bleeding or discharge.  Has an IUD and does not know when her last period was.  States slight dysuria.   Past Medical History:  Diagnosis Date  . Acid reflux   . Anxiety   . Asthma   . Asthma   . Chlamydia   . GERD (gastroesophageal reflux disease)   . Hypertension   . Irritable bowel syndrome   . Obesity     Patient Active Problem List   Diagnosis Date Noted  . BV (bacterial vaginosis) 06/16/2016  . Anemia 11/16/2012  . Chronic cholecystitis 10/26/2012  . Abdominal pain, other specified site 10/25/2012    Past Surgical History:  Procedure Laterality Date  . CHOLECYSTECTOMY  2014    Prior to Admission medications   Medication Sig Start Date End Date Taking? Authorizing Provider  albuterol (PROVENTIL HFA;VENTOLIN HFA) 108 (90 Base) MCG/ACT inhaler Inhale 2 puffs into the lungs every 4 (four) hours as needed for wheezing or shortness of breath. 12/16/17   Triplett, Cari B, FNP  atenolol (TENORMIN) 25 MG tablet Take 25 mg by mouth daily.    [provider]  azithromycin (ZITHROMAX) 250 MG tablet 2 tablets today, then 1 tablet for the next 4 days. 12/16/17   Triplett, Dessa Phi, FNP  levonorgestrel (MIRENA) 20 MCG/24HR IUD 1 each by Intrauterine route once.    [provider]  ondansetron (ZOFRAN ODT) 4 MG disintegrating  tablet Take 1 tablet (4 mg total) by mouth every 8 (eight) hours as needed for nausea or vomiting. 08/15/17   Arta Silence, MD  predniSONE (STERAPRED UNI-PAK 21 TAB) 10 MG (21) TBPK tablet Take 6 tablets on the first day and decrease by 1 tablet each day until finished. 12/16/17   Victorino Dike, FNP    Allergies  Allergen Reactions  . Augmentin [Amoxicillin-Pot Clavulanate] Nausea And Vomiting  . Shellfish Allergy     Family History  Problem Relation Age of Onset  . Hypertension Mother   . Diabetes Mother        DM Type 2    Social History Social History   Tobacco Use  . Smoking status: Never Smoker  . Smokeless tobacco: Never Used  Substance Use Topics  . Alcohol use: No  . Drug use: No    Review of Systems Constitutional: Negative for fever Cardiovascular: Negative for chest pain. Respiratory: Negative for shortness of breath. Gastrointestinal: Positive for abdominal pain, mostly left-sided.  10/10 in severity.  Positive for nausea vomiting diarrhea since last night per patient. Genitourinary: Mild dysuria Musculoskeletal: Negative for musculoskeletal complaints Skin: Negative for skin complaints  Neurological: Negative for headache All other ROS negative  ____________________________________________   PHYSICAL EXAM:  VITAL SIGNS: ED Triage Vitals  Enc Vitals Group     BP 04/20/18 1658 127/79     Pulse Rate 04/20/18 1658 (!) 124  Resp 04/20/18 1658 (!) 22     Temp 04/20/18 1658 98.1 F (36.7 C)     Temp Source 04/20/18 1658 Oral     SpO2 04/20/18 1658 100 %     Weight 04/20/18 1701 185 lb (83.9 kg)     Height 04/20/18 1701 5\' 3"  (1.6 m)     Head Circumference --      Peak Flow --      Pain Score 04/20/18 1701 10     Pain Loc --      Pain Edu? --      Excl. in [place]? --     Constitutional: Alert and oriented. Well appearing and in no distress. Eyes: Normal exam ENT   Head: Normocephalic and atraumatic.   Mouth/Throat: Mucous  membranes are moist. Cardiovascular: Normal rate, regular rhythm. No murmur Respiratory: Normal respiratory effort without tachypnea nor retractions. Breath sounds are clear Gastrointestinal: Soft, moderate left-sided abdominal tenderness palpation.  No rebound guarding or distention. Musculoskeletal: Nontender with normal range of motion in all extremities.  Neurologic:  Normal speech and language. No gross focal neurologic deficits Skin:  Skin is warm, dry and intact.  Psychiatric: Mood and affect are normal.  ____________________________________________     RADIOLOGY  IMPRESSION: No acute findings or other significant abnormality identified.  ____________________________________________   INITIAL IMPRESSION / ASSESSMENT AND PLAN / ED COURSE  Pertinent labs & imaging results that were available during my care of the patient were reviewed by me and considered in my medical decision making (see chart for details).  Patient presents to the emergency department for abdominal pain, mostly left-sided 10/10 in severity.  Patient states nausea vomiting diarrhea since last night.  Differential is quite broad would include colitis, diverticulitis, gastroenteritis, gastritis, pyelonephritis, urinary tract infection.  We will check labs, CT scan and continue to closely monitor.  Will treat pain and nausea, as well as IV hydrate while awaiting results.  Patient's lab results show a moderate leukocytosis of 17,000 otherwise largely within normal limits.  Patient CT scan is negative.  Patient continues to be nauseated, has vomited once again.  States all of her pain is down the epigastrium and left upper quadrant.  We will dose Phenergan and a GI cocktail and reassess.  Patient states her nausea and pain significantly improved however have now come back.  States she is too nauseated to go home.  We will dose a GI cocktail and Reglan, and reassess.  If the patient continues to be nauseated after this  round of medication she may require admission to the hospital service for further treatment.  I discussed with the patient, she states this is happened multiple times before but usually she is able to take something for heartburn and it gets better.  Patient states she is feeling much better.  Is requesting to go home.  Is already called her mother for a ride.  We will discharge with short course of Reglan which seemed to work best for the patient.  Patient now does admit to occasional marijuana use which could possibly be the cause of the patient's significant nausea and vomiting today.  ____________________________________________   FINAL CLINICAL IMPRESSION(S) / ED DIAGNOSES  Abdominal pain Nausea vomiting diarrhea   Harvest Dark, MD 04/20/18 2159

## 2018-04-20 NOTE — ED Notes (Signed)
Patient appears more relaxed. Patient still c/o nausea, states abdominal pain has improved, but describes pain as burning. Dr. Kerman Passey aware.

## 2018-04-20 NOTE — ED Notes (Addendum)
Patient states she is ready to go home. Patient was assisted off the stretcher to the room commode. Dr. Kerman Passey aware.

## 2018-04-20 NOTE — ED Notes (Signed)
Patient given a telephone to call her mother to come get her.

## 2018-04-20 NOTE — ED Triage Notes (Signed)
Per EMS report, patient c/o abdominal pain since last night. Patient c/o vomiting and diarrhea. Patient is tearful upon arrival.

## 2018-04-20 NOTE — ED Notes (Signed)
Patient had a medium amount of bile-like emesis. Patient states pain has improved.

## 2018-04-20 NOTE — ED Notes (Signed)
Patient states she can only drink one bottle of oral contrast. Dr. Kerman Passey aware.

## 2018-09-03 ENCOUNTER — Encounter: Payer: Self-pay | Admitting: Obstetrics and Gynecology

## 2018-09-03 ENCOUNTER — Ambulatory Visit: Payer: BC Managed Care – PPO | Admitting: Obstetrics and Gynecology

## 2018-09-03 ENCOUNTER — Other Ambulatory Visit: Payer: Self-pay

## 2018-09-03 VITALS — BP 114/74 | HR 110 | Ht 63.0 in | Wt 182.0 lb

## 2018-09-03 DIAGNOSIS — Z3009 Encounter for other general counseling and advice on contraception: Secondary | ICD-10-CM

## 2018-09-03 DIAGNOSIS — Z113 Encounter for screening for infections with a predominantly sexual mode of transmission: Secondary | ICD-10-CM | POA: Diagnosis not present

## 2018-09-03 DIAGNOSIS — N760 Acute vaginitis: Secondary | ICD-10-CM

## 2018-09-03 MED ORDER — METRONIDAZOLE 500 MG PO TABS: 500.0000 mg | ORAL_TABLET | Freq: Two times a day (BID) | ORAL | 0 refills | Status: AC

## 2018-09-03 NOTE — Progress Notes (Signed)
Obstetrics & Gynecology Office Visit   Chief Complaint:  Chief Complaint  Patient presents with  . Vaginitis    Irregular discharge w/odor    History of Present Illness: 23 y.o. G1P0101 presenting for follow up of Mirena IUD placement 4 years ago.  The indication for her IUD was contraception.  She reports recurrent episodes of bacterial vaginosis since IUD placement.  No change in soaps or   No recent antibiotics exposures.  She also had an episode of dark old blood and mucous discharge.. No abdominal pain or cramping.  Partner is considering vasectomy, she is interested in contraceptive alternative.  Does have a history of migraines, these were worse on OCP and the patch.  He rlast pregnancy was conceived on the patch.  Review of Systems: Review of Systems  Constitutional: Negative.   Gastrointestinal: Negative.   Skin: Negative for itching and rash.  Neurological: Positive for headaches.  Psychiatric/Behavioral: Negative.     Past Medical History:  Past Medical History:  Diagnosis Date  . Acid reflux   . Anxiety   . Asthma   . Asthma   . Chlamydia   . GERD (gastroesophageal reflux disease)   . Hypertension   . Irritable bowel syndrome   . Obesity     Past Surgical History:  Past Surgical History:  Procedure Laterality Date  . CHOLECYSTECTOMY  2014    Gynecologic History: No LMP recorded. (Menstrual status: IUD).  Obstetric History: G1P0101  Family History:  Family History  Problem Relation Age of Onset  . Hypertension Mother   . Diabetes Mother        DM Type 2    Social History:  Social History   Socioeconomic History  . Marital status: Single    Spouse name: Not on file  . Number of children: Not on file  . Years of education: Not on file  . Highest education level: Not on file  Occupational History  . Not on file  Social Needs  . Financial resource strain: Not on file  . Food insecurity    Worry: Not on file    Inability: Not on file  .  Transportation needs    Medical: Not on file    Non-medical: Not on file  Tobacco Use  . Smoking status: Never Smoker  . Smokeless tobacco: Never Used  Substance and Sexual Activity  . Alcohol use: No  . Drug use: Yes    Types: Marijuana  . Sexual activity: Yes    Birth control/protection: I.U.D.    Comment: Mirena   Lifestyle  . Physical activity    Days per week: Not on file    Minutes per session: Not on file  . Stress: Not on file  Relationships  . Social Herbalist on phone: Not on file    Gets together: Not on file    Attends religious service: Not on file    Active member of club or organization: Not on file    Attends meetings of clubs or organizations: Not on file    Relationship status: Not on file  . Intimate partner violence    Fear of current or ex partner: Not on file    Emotionally abused: Not on file    Physically abused: Not on file    Forced sexual activity: Not on file  Other Topics Concern  . Not on file  Social History Narrative  . Not on file    Allergies:  Allergies  Allergen Reactions  . Augmentin [Amoxicillin-Pot Clavulanate] Nausea And Vomiting  . Shellfish Allergy     Medications: Prior to Admission medications   Medication Sig Start Date End Date Taking? Authorizing Provider  albuterol (PROVENTIL HFA;VENTOLIN HFA) 108 (90 Base) MCG/ACT inhaler Inhale 2 puffs into the lungs every 4 (four) hours as needed for wheezing or shortness of breath. 12/16/17  Yes Triplett, Cari B, FNP  levonorgestrel (MIRENA) 20 MCG/24HR IUD 1 each by Intrauterine route once.   Yes [provider]  ondansetron (ZOFRAN ODT) 4 MG disintegrating tablet Take 1 tablet (4 mg total) by mouth every 8 (eight) hours as needed for nausea or vomiting. 08/15/17  Yes Arta Silence, MD  atenolol (TENORMIN) 25 MG tablet Take 25 mg by mouth daily.    [provider]  metoCLOPramide (REGLAN) 10 MG tablet Take 1 tablet (10 mg total) by mouth every 6  (six) hours as needed for nausea. Patient not taking: Reported on 09/03/2018 04/20/18   Harvest Dark, MD    Physical Exam Blood pressure 114/74, pulse (!) 110, height 5\' 3"  (1.6 m), weight 182 lb (82.6 kg). No LMP recorded. (Menstrual status: IUD).  General: NAD HEENT: normocephalic, anicteric Pulmonary: No increased work of breathing Genitourinary:  External: Normal external female genitalia.  Normal urethral meatus, normal  Bartholin's and Skene's glands.    Vagina: Normal vaginal mucosa, no evidence of prolapse.    Cervix: Grossly normal in appearance, no bleeding, IUD strings visualized 2cm  Uterus: Non-enlarged, mobile, normal contour.  No CMT  Adnexa: ovaries non-enlarged, no adnexal masses  Rectal: deferred  Lymphatic: no evidence of inguinal lymphadenopathy Extremities: no edema, erythema, or tenderness Neurologic: Grossly intact Psychiatric: mood appropriate, affect full  Female chaperone present for pelvic and breast  portions of the physical exam  Assessment: 23 y.o. G1P0101 recurrent BV, contraceptive management Plan: Problem List Items Addressed This Visit    None    Visit Diagnoses    Recurrent vaginitis    -  Primary   Relevant Orders   NuSwab Vaginitis Plus (VG+)   Routine screening for STI (sexually transmitted infection)       Relevant Orders   NuSwab Vaginitis Plus (VG+)   Encounter for general counseling and advice on contraceptive management           1.  Recurrent BV - Rx flagyl, nuswab collected  2) AUB - discussed breakthrough bleeding pattern on IUD.  She is interested in removing so will hold on ultrasound to evaluate placement.  Given history of migraines we discussed progestin only options including progestin only pills, depo provera, and nexplanon.  In addition non-hormonal IUD paraguard.   - will consider option and contact office once she has made decision  3) A total of 15 minutes were spent in face-to-face contact with the patient  during this encounter with over half of that time devoted to counseling and coordination of care.  4) Return call once decision made on new contraceptive.   Malachy Mood, MD, [facility], [facility] 09/03/2018, 11:09 AM

## 2018-09-08 LAB — NUSWAB VAGINITIS PLUS (VG+)
BVAB 2: HIGH Score — AB
Candida albicans, NAA: NEGATIVE
Candida glabrata, NAA: NEGATIVE
Chlamydia trachomatis, NAA: NEGATIVE
Megasphaera 1: HIGH Score — AB
Neisseria gonorrhoeae, NAA: NEGATIVE
Trich vag by NAA: NEGATIVE

## 2019-02-24 ENCOUNTER — Ambulatory Visit: Payer: Self-pay | Admitting: Obstetrics and Gynecology

## 2019-02-28 NOTE — Progress Notes (Signed)
Steele Sizer, MD   Chief Complaint  Patient presents with  . Vaginal Discharge    fishy odor, no itchiness/irritation x 1 week  . Contraception    wants to talk abt new BC    HPI:      Ms. Becky Jackson is a 24 y.o. G1P0101 who LMP was Patient's last menstrual period was 01/01/2019 (exact date)., presents today for increased vag d/c with odor off and on for 2 wks. Noticed mild itching today. Having pelvic cramping/LBP, no fevers. Has a hx of recurrent BV. No recent abx use. Using dove sens skin soap, no dryer sheets, no thongs, no wipes. Is not taking probiotics, using condoms, or using boric acid supp any longer. Pt is sex active, no new partners.  Has Mirena, placed 08/08/14. Has occas spotting with IUD. Concerned IUD is causing BV sx. Considering other [facility] but unsure of kind. Got pregnant on patch, increased headaches on pills, doesn't want wt gain with depo.   Patient Active Problem List   Diagnosis Date Noted  . BV (bacterial vaginosis) 06/16/2016  . Anemia 11/16/2012  . Chronic cholecystitis 10/26/2012  . Abdominal pain, other specified site 10/25/2012    Past Surgical History:  Procedure Laterality Date  . CHOLECYSTECTOMY  2014    Family History  Problem Relation Age of Onset  . Hypertension Mother   . Diabetes Mother        DM Type 2    Social History   Socioeconomic History  . Marital status: Single    Spouse name: Not on file  . Number of children: Not on file  . Years of education: Not on file  . Highest education level: Not on file  Occupational History  . Not on file  Tobacco Use  . Smoking status: Never Smoker  . Smokeless tobacco: Never Used  Substance and Sexual Activity  . Alcohol use: No  . Drug use: Yes    Types: Marijuana  . Sexual activity: Yes    Birth control/protection: I.U.D.    Comment: Mirena   Other Topics Concern  . Not on file  Social History Narrative  . Not on file   Social Determinants of Health   Financial  Resource Strain:   . Difficulty of Paying Living Expenses: Not on file  Food Insecurity:   . Worried About Charity fundraiser in the Last Year: Not on file  . Ran Out of Food in the Last Year: Not on file  Transportation Needs:   . Lack of Transportation (Medical): Not on file  . Lack of Transportation (Non-Medical): Not on file  Physical Activity:   . Days of Exercise per Week: Not on file  . Minutes of Exercise per Session: Not on file  Stress:   . Feeling of Stress : Not on file  Social Connections:   . Frequency of Communication with Friends and Family: Not on file  . Frequency of Social Gatherings with Friends and Family: Not on file  . Attends Religious Services: Not on file  . Active Member of Clubs or Organizations: Not on file  . Attends Archivist Meetings: Not on file  . Marital Status: Not on file  Intimate Partner Violence:   . Fear of Current or Ex-Partner: Not on file  . Emotionally Abused: Not on file  . Physically Abused: Not on file  . Sexually Abused: Not on file    Outpatient Medications Prior to Visit  Medication Sig Dispense Refill  .  albuterol (PROVENTIL HFA;VENTOLIN HFA) 108 (90 Base) MCG/ACT inhaler Inhale 2 puffs into the lungs every 4 (four) hours as needed for wheezing or shortness of breath. 1 Inhaler 1  . atenolol (TENORMIN) 25 MG tablet Take 25 mg by mouth daily.    Marland Kitchen levonorgestrel (MIRENA) 20 MCG/24HR IUD 1 each by Intrauterine route once.    . metoCLOPramide (REGLAN) 10 MG tablet Take 1 tablet (10 mg total) by mouth every 6 (six) hours as needed for nausea. 20 tablet 0  . ondansetron (ZOFRAN ODT) 4 MG disintegrating tablet Take 1 tablet (4 mg total) by mouth every 8 (eight) hours as needed for nausea or vomiting. 12 tablet 0   No facility-administered medications prior to visit.      ROS:  Review of Systems  Constitutional: Negative for fever.  Gastrointestinal: Negative for blood in stool, constipation, diarrhea, nausea and  vomiting.  Genitourinary: Positive for vaginal discharge. Negative for dyspareunia, dysuria, flank pain, frequency, hematuria, urgency, vaginal bleeding and vaginal pain.  Musculoskeletal: Negative for back pain.  Skin: Negative for rash.  BREAST: No symptoms   OBJECTIVE:   Vitals:  BP 110/80   Ht 5\' 3"  (1.6 m)   Wt 177 lb (80.3 kg)   LMP 01/01/2019 (Exact Date)   BMI 31.35 kg/m   Physical Exam Vitals reviewed.  Constitutional:      Appearance: She is well-developed.  Pulmonary:     Effort: Pulmonary effort is normal.  Genitourinary:    General: Normal vulva.     Pubic Area: No rash.      Labia:        Right: No rash, tenderness or lesion.        Left: No rash, tenderness or lesion.      Vagina: Normal. No vaginal discharge, erythema or tenderness.     Cervix: Normal.     Uterus: Normal. Tender. Not enlarged.      Adnexa: Right adnexa normal and left adnexa normal.       Right: No mass or tenderness.         Left: No mass or tenderness.       Comments: IUD STRINGS IN CX OS Musculoskeletal:        General: Normal range of motion.     Cervical back: Normal range of motion.  Skin:    General: Skin is warm and dry.  Neurological:     General: No focal deficit present.     Mental Status: She is alert and oriented to person, place, and time.  Psychiatric:        Mood and Affect: Mood normal.        Behavior: Behavior normal.        Thought Content: Thought content normal.        Judgment: Judgment normal.     Results: Results for orders placed or performed in visit on 03/01/19 (from the past 24 hour(s))  POCT Wet Prep with KOH     Status: Abnormal   Collection Time: 03/01/19 10:53 AM  Result Value Ref Range   Trichomonas, UA Negative    Clue Cells Wet Prep HPF POC pos    Epithelial Wet Prep HPF POC     Yeast Wet Prep HPF POC neg    Bacteria Wet Prep HPF POC     RBC Wet Prep HPF POC     WBC Wet Prep HPF POC     KOH Prep POC Positive (A) Negative      Assessment/Plan:  Bacterial vaginosis - Plan: POCT Wet Prep with KOH, metroNIDAZOLE (FLAGYL) 500 MG tablet; Pos sx/ wet prep. Rx Flagyl, no EtOH. Condoms, add probiotics, boric acid supp QHS for 1-2 months. F/u prn.   Encounter for other general counseling or advice on contraception--Discussed other BC options. Pt unsure what she would want. Will try above items for BV tx/prevention and f/u if sx persist for IUD removal and change to different BC.     Meds ordered this encounter  Medications  . metroNIDAZOLE (FLAGYL) 500 MG tablet    Sig: Take 1 tablet (500 mg total) by mouth 2 (two) times daily for 7 days.    Dispense:  14 tablet    Refill:  0    Order Specific Question:   Supervising Provider    Answer:   Gae Dry [id number]      Return if symptoms worsen or fail to improve.  [facility] B. Raileigh Sabater, PA-C 03/01/2019 10:54 AM

## 2019-03-01 ENCOUNTER — Other Ambulatory Visit: Payer: Self-pay

## 2019-03-01 ENCOUNTER — Ambulatory Visit (INDEPENDENT_AMBULATORY_CARE_PROVIDER_SITE_OTHER): Payer: BC Managed Care – PPO | Admitting: Obstetrics and Gynecology

## 2019-03-01 ENCOUNTER — Encounter: Payer: Self-pay | Admitting: Obstetrics and Gynecology

## 2019-03-01 VITALS — BP 110/80 | Ht 63.0 in | Wt 177.0 lb

## 2019-03-01 DIAGNOSIS — Z3009 Encounter for other general counseling and advice on contraception: Secondary | ICD-10-CM

## 2019-03-01 DIAGNOSIS — N760 Acute vaginitis: Secondary | ICD-10-CM | POA: Diagnosis not present

## 2019-03-01 DIAGNOSIS — B9689 Other specified bacterial agents as the cause of diseases classified elsewhere: Secondary | ICD-10-CM | POA: Diagnosis not present

## 2019-03-01 DIAGNOSIS — Z975 Presence of (intrauterine) contraceptive device: Secondary | ICD-10-CM | POA: Diagnosis not present

## 2019-03-01 LAB — POCT WET PREP WITH KOH
Clue Cells Wet Prep HPF POC: POSITIVE
KOH Prep POC: POSITIVE — AB
Trichomonas, UA: NEGATIVE
Yeast Wet Prep HPF POC: NEGATIVE

## 2019-03-01 MED ORDER — METRONIDAZOLE 500 MG PO TABS
500.0000 mg | ORAL_TABLET | Freq: Two times a day (BID) | ORAL | 0 refills | Status: AC
Start: 2019-03-01 — End: 2019-03-08

## 2019-03-01 NOTE — Patient Instructions (Signed)
I value your feedback and entrusting us with your care. If you get a [place] patient survey, I would appreciate you taking the time to let us know about your experience today. Thank you!  As of January 06, 2019, your lab results will be released to your MyChart immediately, before I even have a chance to see them. Please give me time to review them and contact you if there are any abnormalities. Thank you for your patience.  

## 2019-04-23 IMAGING — CT CT ABDOMEN AND PELVIS WITH CONTRAST
2 of 4 series · 17 of 46 positions shown, 19 images · IV contrast (APPLIED)
Comparison: 04/28/2016

CLINICAL DATA: Severe left-sided abdominal pain beginning last
night. Vomiting and diarrhea.

EXAM:
CT ABDOMEN AND PELVIS WITH CONTRAST
TECHNIQUE: Multidetector CT imaging of the abdomen and pelvis was performed
using the standard protocol following bolus administration of
intravenous contrast.
CONTRAST:  100mL OMNIPAQUE IOHEXOL 300 MG/ML  SOLN

[Series 2: axial st · axial · 0.85mm/px · z∈[-1168,-718]mm · 14 of 98 slices shown, 16 images]
[im 4/98  soft-tissue]
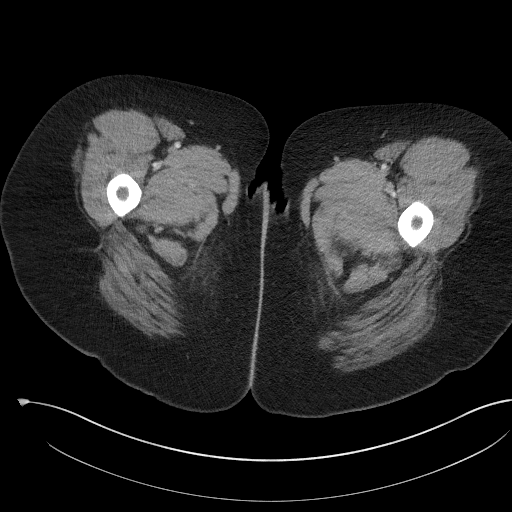
[im 4/98  bone]
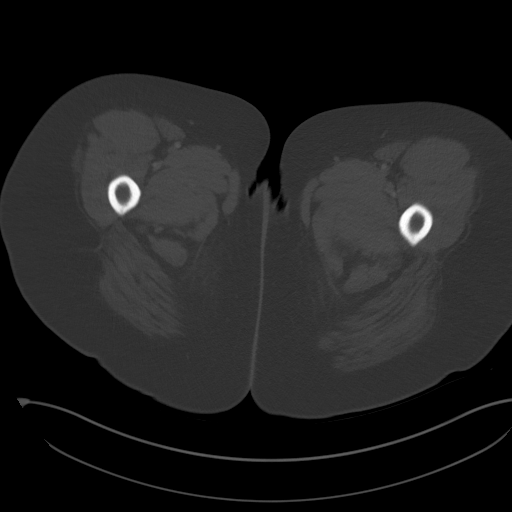
[im 12/98  soft-tissue]
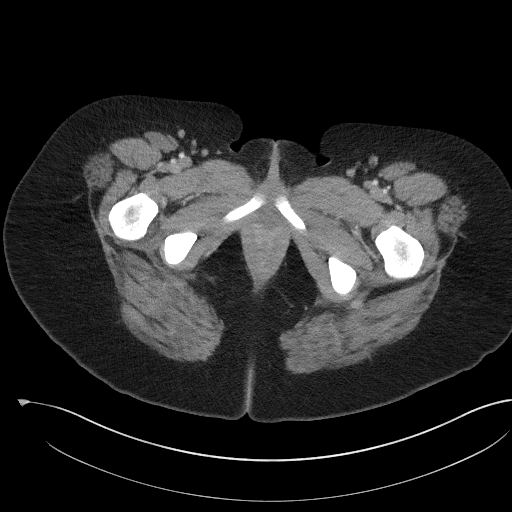
[im 20/98  soft-tissue]
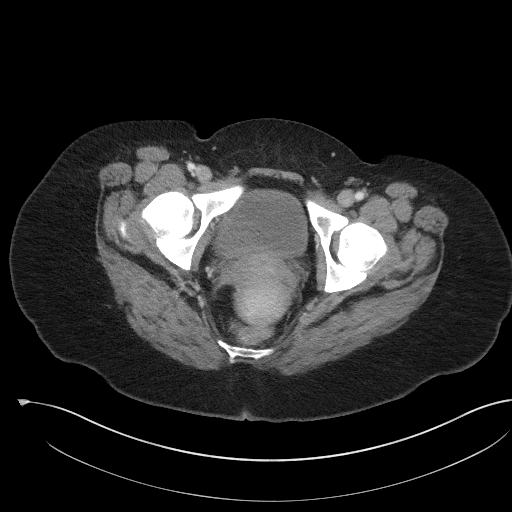
[im 28/98  soft-tissue]
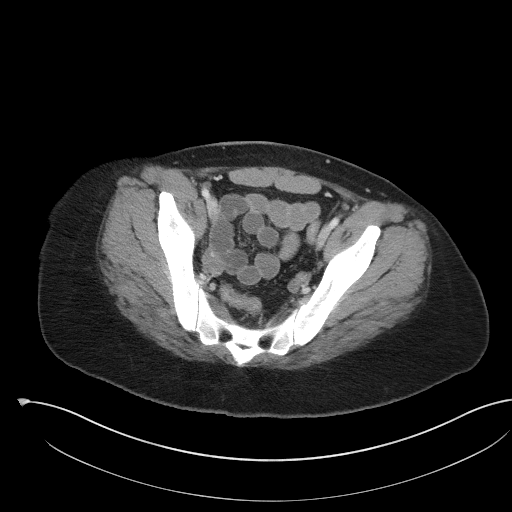
[im 32/98  soft-tissue]
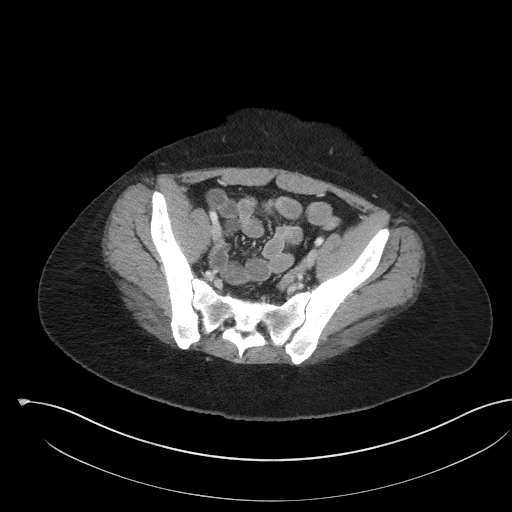
[im 39/98  soft-tissue]
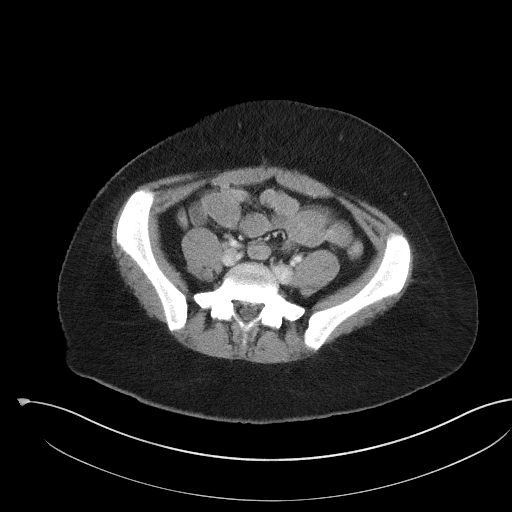
[im 47/98  soft-tissue]
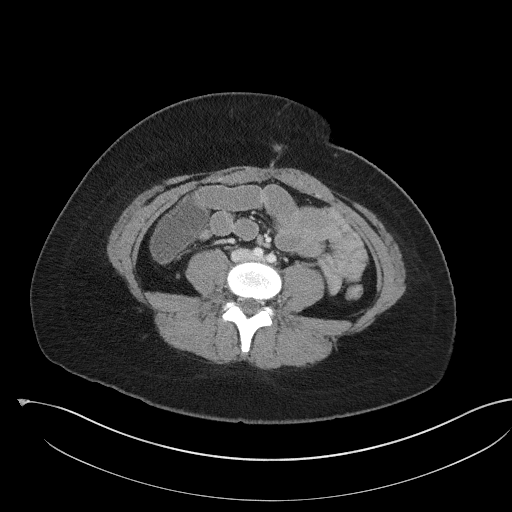
[im 51/98  soft-tissue]
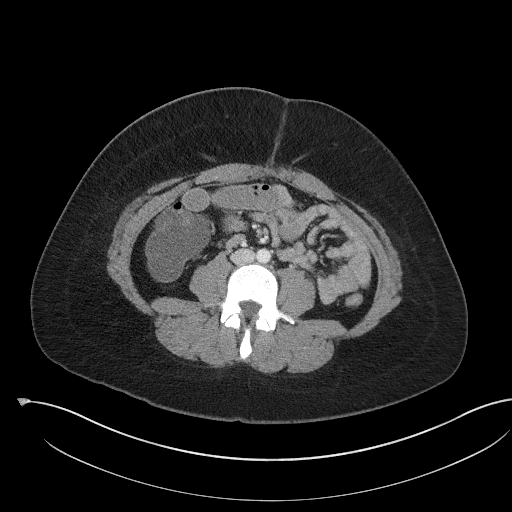
[im 59/98  soft-tissue]
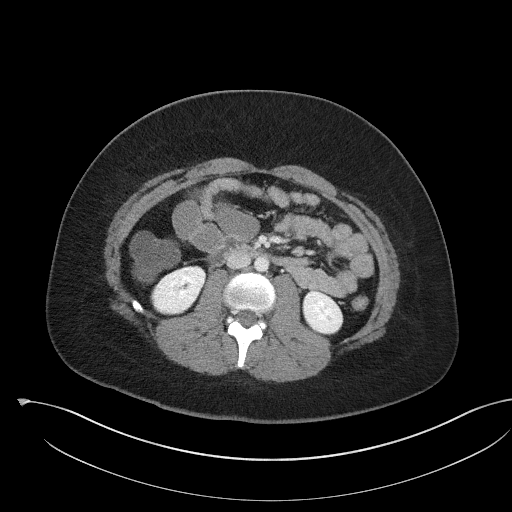
[im 59/98  bone]
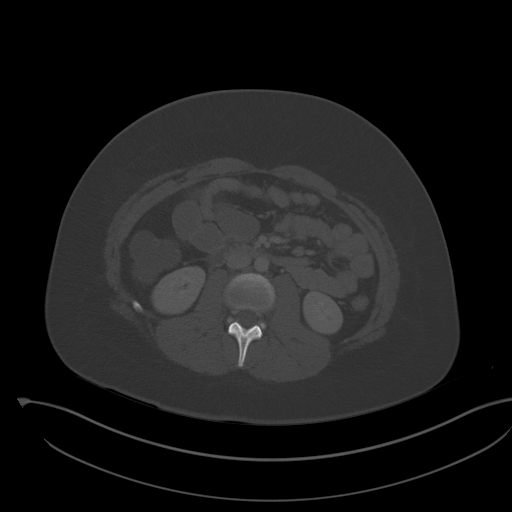
[im 66/98  soft-tissue]
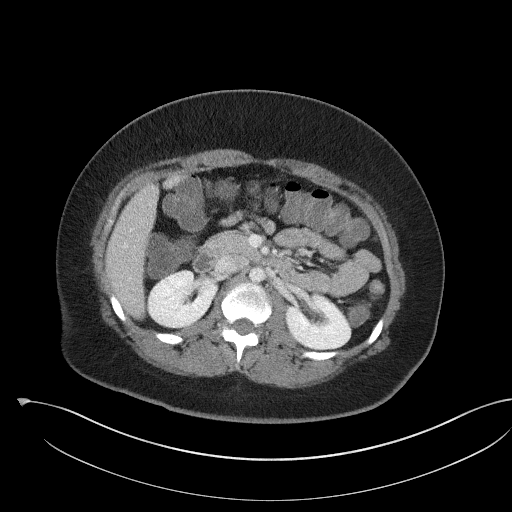
[im 74/98  soft-tissue]
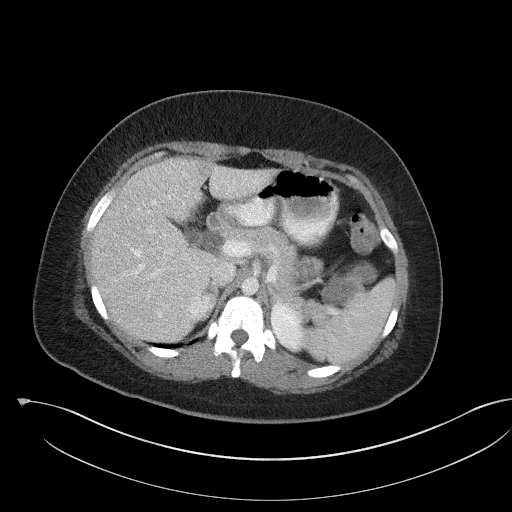
[im 78/98  soft-tissue]
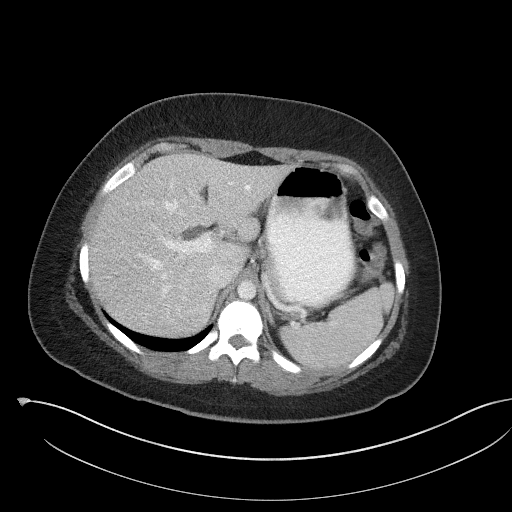
[im 86/98  soft-tissue]
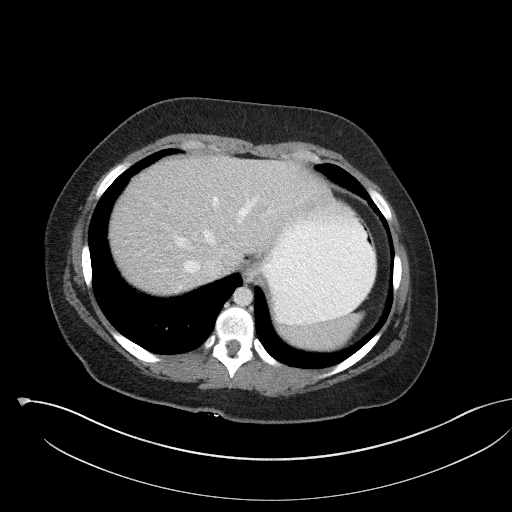
[im 94/98  soft-tissue]
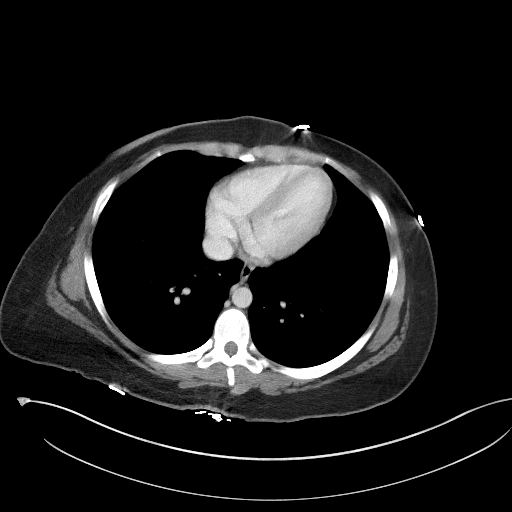

[Series 5: coronal st · coronal · 0.90mm/px · 3 of 90 slices shown]
[im 30/90  soft-tissue]
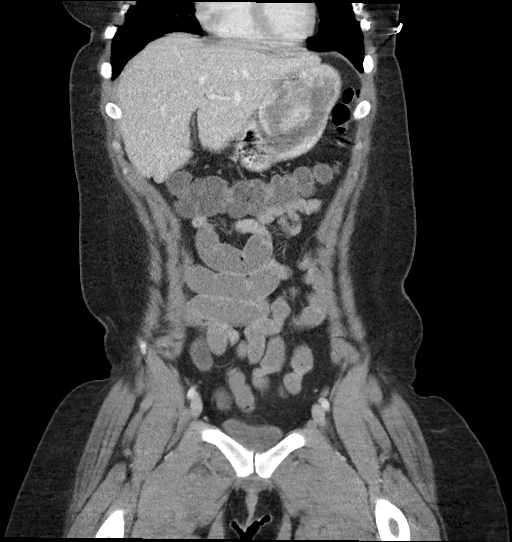
[im 40/90  soft-tissue]
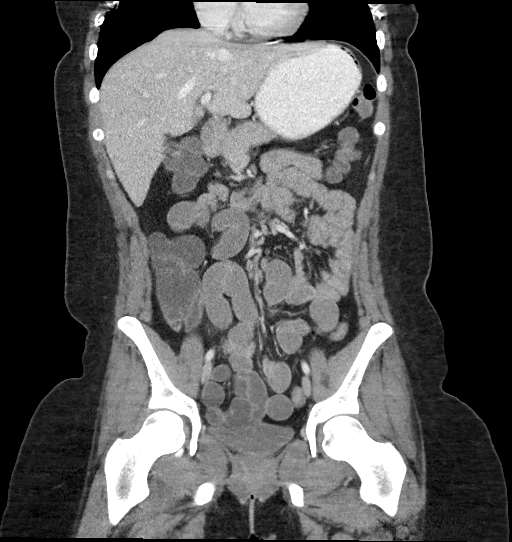
[im 50/90  soft-tissue]
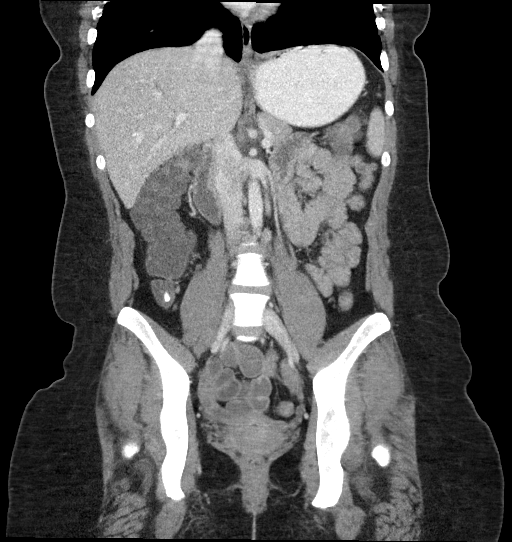

[17 of 46 positions shown; findings below may reference images not displayed]

FINDINGS: Lower Chest: No acute findings.

Hepatobiliary: No hepatic masses identified. Prior cholecystectomy.
No evidence of biliary obstruction.

Pancreas:  No mass or inflammatory changes.

Spleen: Within normal limits in size and appearance.

Adrenals/Urinary Tract: No masses identified. No evidence of
hydronephrosis.

Stomach/Bowel: No evidence of obstruction, inflammatory process or
abnormal fluid collections. Normal appendix visualized.

Vascular/Lymphatic: No pathologically enlarged lymph nodes. No
abdominal aortic aneurysm.

Reproductive: No mass or other significant abnormality. IUD seen
expected position in the uterus.

Other:  None.

Musculoskeletal:  No suspicious bone lesions identified.
IMPRESSION: No acute findings or other significant abnormality identified.

## 2020-06-07 DIAGNOSIS — Z1159 Encounter for screening for other viral diseases: Secondary | ICD-10-CM | POA: Diagnosis not present

## 2020-06-07 DIAGNOSIS — R0981 Nasal congestion: Secondary | ICD-10-CM | POA: Diagnosis not present

## 2020-06-07 DIAGNOSIS — R5383 Other fatigue: Secondary | ICD-10-CM | POA: Diagnosis not present

## 2020-06-07 DIAGNOSIS — E669 Obesity, unspecified: Secondary | ICD-10-CM | POA: Diagnosis not present

## 2020-07-10 DIAGNOSIS — E669 Obesity, unspecified: Secondary | ICD-10-CM | POA: Diagnosis not present

## 2020-09-19 DIAGNOSIS — R5383 Other fatigue: Secondary | ICD-10-CM | POA: Diagnosis not present

## 2020-09-19 DIAGNOSIS — J4520 Mild intermittent asthma, uncomplicated: Secondary | ICD-10-CM | POA: Diagnosis not present

## 2020-09-19 DIAGNOSIS — E669 Obesity, unspecified: Secondary | ICD-10-CM | POA: Diagnosis not present

## 2020-09-19 DIAGNOSIS — Z309 Encounter for contraceptive management, unspecified: Secondary | ICD-10-CM | POA: Diagnosis not present

## 2020-12-19 DIAGNOSIS — E669 Obesity, unspecified: Secondary | ICD-10-CM | POA: Diagnosis not present

## 2020-12-19 DIAGNOSIS — Z01419 Encounter for gynecological examination (general) (routine) without abnormal findings: Secondary | ICD-10-CM | POA: Diagnosis not present

## 2020-12-19 DIAGNOSIS — Z124 Encounter for screening for malignant neoplasm of cervix: Secondary | ICD-10-CM | POA: Diagnosis not present

## 2020-12-19 DIAGNOSIS — Z6839 Body mass index (BMI) 39.0-39.9, adult: Secondary | ICD-10-CM | POA: Diagnosis not present

## 2021-12-05 ENCOUNTER — Other Ambulatory Visit: Payer: Self-pay | Admitting: Gastroenterology

## 2021-12-05 ENCOUNTER — Ambulatory Visit
Admission: RE | Admit: 2021-12-05 | Discharge: 2021-12-05 | Disposition: A | Discharge status: Home / Self Care | Payer: Managed Care, Other (non HMO) | Source: Ambulatory Visit | Attending: Gastroenterology | Admitting: Gastroenterology

## 2021-12-05 DIAGNOSIS — R197 Diarrhea, unspecified: Secondary | ICD-10-CM

## 2021-12-05 DIAGNOSIS — R1013 Epigastric pain: Secondary | ICD-10-CM

## 2021-12-05 DIAGNOSIS — R1084 Generalized abdominal pain: Secondary | ICD-10-CM

## 2021-12-05 DIAGNOSIS — R112 Nausea with vomiting, unspecified: Secondary | ICD-10-CM

## 2022-01-06 ENCOUNTER — Ambulatory Visit: Payer: Managed Care, Other (non HMO) | Admitting: Anesthesiology

## 2022-01-06 ENCOUNTER — Encounter
Admission: RE | Disposition: A | Discharge status: Home / Self Care | Payer: Self-pay | Source: Ambulatory Visit | Attending: Gastroenterology

## 2022-01-06 ENCOUNTER — Ambulatory Visit
Admission: RE | Admit: 2022-01-06 | Discharge: 2022-01-06 | Disposition: A | Discharge status: Home / Self Care | Payer: Managed Care, Other (non HMO) | Source: Ambulatory Visit | Attending: Gastroenterology | Admitting: Gastroenterology

## 2022-01-06 ENCOUNTER — Encounter: Payer: Self-pay | Admitting: *Deleted

## 2022-01-06 DIAGNOSIS — F419 Anxiety disorder, unspecified: Secondary | ICD-10-CM | POA: Diagnosis not present

## 2022-01-06 DIAGNOSIS — K640 First degree hemorrhoids: Secondary | ICD-10-CM | POA: Diagnosis not present

## 2022-01-06 DIAGNOSIS — K529 Noninfective gastroenteritis and colitis, unspecified: Secondary | ICD-10-CM | POA: Diagnosis present

## 2022-01-06 DIAGNOSIS — Z6831 Body mass index (BMI) 31.0-31.9, adult: Secondary | ICD-10-CM | POA: Diagnosis not present

## 2022-01-06 DIAGNOSIS — K2970 Gastritis, unspecified, without bleeding: Secondary | ICD-10-CM | POA: Insufficient documentation

## 2022-01-06 DIAGNOSIS — I10 Essential (primary) hypertension: Secondary | ICD-10-CM | POA: Diagnosis not present

## 2022-01-06 DIAGNOSIS — D122 Benign neoplasm of ascending colon: Secondary | ICD-10-CM | POA: Insufficient documentation

## 2022-01-06 DIAGNOSIS — K219 Gastro-esophageal reflux disease without esophagitis: Secondary | ICD-10-CM | POA: Diagnosis not present

## 2022-01-06 DIAGNOSIS — K580 Irritable bowel syndrome with diarrhea: Secondary | ICD-10-CM | POA: Insufficient documentation

## 2022-01-06 DIAGNOSIS — J45909 Unspecified asthma, uncomplicated: Secondary | ICD-10-CM | POA: Diagnosis not present

## 2022-01-06 DIAGNOSIS — R1084 Generalized abdominal pain: Secondary | ICD-10-CM | POA: Diagnosis not present

## 2022-01-06 DIAGNOSIS — E669 Obesity, unspecified: Secondary | ICD-10-CM | POA: Diagnosis not present

## 2022-01-06 HISTORY — PX: COLONOSCOPY WITH PROPOFOL: SHX5780

## 2022-01-06 HISTORY — PX: ESOPHAGOGASTRODUODENOSCOPY (EGD) WITH PROPOFOL: SHX5813

## 2022-01-06 LAB — POCT PREGNANCY, URINE: Preg Test, Ur: NEGATIVE

## 2022-01-06 SURGERY — COLONOSCOPY WITH PROPOFOL
Anesthesia: General

## 2022-01-06 MED ORDER — DEXMEDETOMIDINE HCL IN NACL 80 MCG/20ML IV SOLN
INTRAVENOUS | Status: AC
  Filled 2022-01-06: qty 20

## 2022-01-06 MED ORDER — PROPOFOL 10 MG/ML IV BOLUS
INTRAVENOUS | Status: AC
  Filled 2022-01-06: qty 20

## 2022-01-06 MED ORDER — PROPOFOL 500 MG/50ML IV EMUL
INTRAVENOUS | Status: DC | PRN
  Administered 2022-01-06: 200 ug/kg/min via INTRAVENOUS

## 2022-01-06 MED ORDER — LIDOCAINE HCL (PF) 2 % IJ SOLN
INTRAMUSCULAR | Status: AC
  Filled 2022-01-06: qty 5

## 2022-01-06 MED ORDER — DEXMEDETOMIDINE HCL IN NACL 80 MCG/20ML IV SOLN
INTRAVENOUS | Status: DC | PRN
  Administered 2022-01-06 (×4): 8 ug via BUCCAL

## 2022-01-06 MED ORDER — PROPOFOL 10 MG/ML IV BOLUS
INTRAVENOUS | Status: DC | PRN
  Administered 2022-01-06 (×3): 50 mg via INTRAVENOUS

## 2022-01-06 MED ORDER — SODIUM CHLORIDE 0.9 % IV SOLN: INTRAVENOUS | Status: DC

## 2022-01-06 MED ORDER — STERILE WATER FOR IRRIGATION IR SOLN
Status: DC | PRN
  Administered 2022-01-06: 60 mL

## 2022-01-06 MED ORDER — LIDOCAINE HCL (CARDIAC) PF 100 MG/5ML IV SOSY
PREFILLED_SYRINGE | INTRAVENOUS | Status: DC | PRN
  Administered 2022-01-06 (×2): 50 mg via INTRAVENOUS

## 2022-01-06 NOTE — Op Note (Signed)
[facility] Gastroenterology Patient Name: Becky Jackson Procedure Date: 01/06/2022 12:35 PM MRN: [id number] Account #: [id number] Date of Birth: Sep 12, 1995 Admit Type: Outpatient Age: 26 Room: [facility] ENDO ROOM 3 Gender: Female Note Status: Finalized Instrument Name: Michaelle Birks [id number] Procedure:             Upper GI endoscopy Indications:           Generalized abdominal pain Providers:             Andrey Farmer MD, MD Referring MD:          No Local Md, MD (Referring MD) Medicines:             Monitored Anesthesia Care Complications:         No immediate complications. Estimated blood loss:                         Minimal. Procedure:             Pre-Anesthesia Assessment:                        - Prior to the procedure, a History and Physical was                         performed, and patient medications and allergies were                         reviewed. The patient is competent. The risks and                         benefits of the procedure and the sedation options and                         risks were discussed with the patient. All questions                         were answered and informed consent was obtained.                         Patient identification and proposed procedure were                         verified by the physician, the nurse, the                         anesthesiologist, the anesthetist and the technician                         in the endoscopy suite. Mental Status Examination:                         alert and oriented. Airway Examination: normal                         oropharyngeal airway and neck mobility. Respiratory                         Examination: clear to auscultation. CV Examination:  normal. Prophylactic Antibiotics: The patient does not                         require prophylactic antibiotics. Prior                         Anticoagulants: The patient has taken no anticoagulant                          or antiplatelet agents. ASA Grade Assessment: III - A                         patient with severe systemic disease. After reviewing                         the risks and benefits, the patient was deemed in                         satisfactory condition to undergo the procedure. The                         anesthesia plan was to use monitored anesthesia care                         (MAC). Immediately prior to administration of                         medications, the patient was re-assessed for adequacy                         to receive sedatives. The heart rate, respiratory                         rate, oxygen saturations, blood pressure, adequacy of                         pulmonary ventilation, and response to care were                         monitored throughout the procedure. The physical                         status of the patient was re-assessed after the                         procedure.                        After obtaining informed consent, the endoscope was                         passed under direct vision. Throughout the procedure,                         the patient's blood pressure, pulse, and oxygen                         saturations were monitored continuously. The Endoscope  was introduced through the mouth, and advanced to the                         second part of duodenum. The upper GI endoscopy was                         accomplished without difficulty. The patient tolerated                         the procedure well. Findings:      The examined esophagus was normal.      Patchy mild inflammation characterized by erythema was found in the       gastric antrum. Biopsies were taken with a cold forceps for Helicobacter       pylori testing. Estimated blood loss was minimal.      The examined duodenum was normal. Impression:            - Normal esophagus.                        - Gastritis. Biopsied.                        -  Normal examined duodenum. Recommendation:        - Discharge patient to home.                        - Resume previous diet.                        - Continue present medications.                        - Await pathology results.                        - Return to referring physician as previously                         scheduled. Procedure Code(s):     --- Professional ---                        [phone], Esophagogastroduodenoscopy, flexible,                         transoral; with biopsy, single or multiple Diagnosis Code(s):     --- Professional ---                        K29.70, Gastritis, unspecified, without bleeding                        R10.84, Generalized abdominal pain CPT copyright 2022 American Medical Association. All rights reserved. The codes documented in this report are preliminary and upon coder review may  be revised to meet current compliance requirements. Andrey Farmer MD, MD 01/06/2022 1:35:40 PM Number of Addenda: 0 Note Initiated On: 01/06/2022 12:35 PM Estimated Blood Loss:  Estimated blood loss was minimal.      [facility]

## 2022-01-06 NOTE — H&P (Signed)
Outpatient short stay form Pre-procedure 01/06/2022  [name], MD  Primary Physician: Bubba Camp, [place]  Reason for visit:  Abdominal pain/diarrhea  History of present illness:    26 y/o lady with history of IBS, anxiety, and hypertension here for EGD/Colonoscopy for abdominal pain and diarrhea. Family history of IBS. No blood thinners. History of cholecystectomy. Negative celiac studies and negative fecal calprotectin.   Current Facility-Administered Medications:    0.9 %  sodium chloride infusion, , Intravenous, Continuous, Kilan Banfill, Hilton Cork, MD  Medications Prior to Admission  Medication Sig Dispense Refill Last Dose   albuterol (PROVENTIL HFA;VENTOLIN HFA) 108 (90 Base) MCG/ACT inhaler Inhale 2 puffs into the lungs every 4 (four) hours as needed for wheezing or shortness of breath. 1 Inhaler 1    atenolol (TENORMIN) 25 MG tablet Take 25 mg by mouth daily. (Patient not taking: Reported on 01/06/2022)   Not Taking   levonorgestrel (MIRENA) 20 MCG/24HR IUD 1 each by Intrauterine route once.      metoCLOPramide (REGLAN) 10 MG tablet Take 1 tablet (10 mg total) by mouth every 6 (six) hours as needed for nausea. 20 tablet 0    ondansetron (ZOFRAN ODT) 4 MG disintegrating tablet Take 1 tablet (4 mg total) by mouth every 8 (eight) hours as needed for nausea or vomiting. 12 tablet 0      Allergies  Allergen Reactions   Augmentin [Amoxicillin-Pot Clavulanate] Nausea And Vomiting   Shellfish Allergy      Past Medical History:  Diagnosis Date   Acid reflux    Anxiety    Asthma    Asthma    Chlamydia    GERD (gastroesophageal reflux disease)    Hypertension    Irritable bowel syndrome    Obesity     Review of systems:  Otherwise negative.    Physical Exam  Gen: Alert, oriented. Appears stated age.  HEENT: PERRLA. Lungs: No respiratory distress CV: RRR Abd: soft, benign, no masses Ext: No edema    Planned procedures: Proceed with EGD/colonoscopy.  The patient understands the nature of the planned procedure, indications, risks, alternatives and potential complications including but not limited to bleeding, infection, perforation, damage to internal organs and possible oversedation/side effects from anesthesia. The patient agrees and gives consent to proceed.  Please refer to procedure notes for findings, recommendations and patient disposition/instructions.     [name], MD [facility] Gastroenterology

## 2022-01-06 NOTE — Transfer of Care (Signed)
Immediate Anesthesia Transfer of Care Note  Patient: Becky Jackson  Procedure(s) Performed: COLONOSCOPY WITH PROPOFOL ESOPHAGOGASTRODUODENOSCOPY (EGD) WITH PROPOFOL  Patient Location: PACU  Anesthesia Type:General  Level of Consciousness: drowsy  Airway & Oxygen Therapy: Patient Spontanous Breathing and Patient connected to face mask oxygen  Post-op Assessment: Report given to RN and Post -op Vital signs reviewed and stable  Post vital signs: Reviewed and stable  Last Vitals:  Vitals Value Taken Time  BP 90/58 01/06/22 1322  Temp 35.7 C 01/06/22 1322  Pulse 68 01/06/22 1327  Resp 25 01/06/22 1327  SpO2 100 % 01/06/22 1327  Vitals shown include unvalidated device data.  Last Pain:  Vitals:   01/06/22 1322  TempSrc: Temporal  PainSc: Asleep         Complications: No notable events documented.

## 2022-01-06 NOTE — Op Note (Signed)
[facility] Gastroenterology Patient Name: Becky Jackson Procedure Date: 01/06/2022 12:34 PM MRN: [id number] Account #: [id number] Date of Birth: Nov 02, 1995 Admit Type: Outpatient Age: 26 Room: [facility] ENDO ROOM 3 Gender: Female Note Status: Finalized Instrument Name: Jasper Riling [id number] Procedure:             Colonoscopy Indications:           Generalized abdominal pain, Chronic diarrhea Providers:             Andrey Farmer MD, MD Referring MD:          No Local Md, MD (Referring MD) Medicines:             Monitored Anesthesia Care Complications:         No immediate complications. Estimated blood loss:                         Minimal. Procedure:             Pre-Anesthesia Assessment:                        - Prior to the procedure, a History and Physical was                         performed, and patient medications and allergies were                         reviewed. The patient is competent. The risks and                         benefits of the procedure and the sedation options and                         risks were discussed with the patient. All questions                         were answered and informed consent was obtained.                         Patient identification and proposed procedure were                         verified by the physician, the nurse, the                         anesthesiologist, the anesthetist and the technician                         in the endoscopy suite. Mental Status Examination:                         alert and oriented. Airway Examination: normal                         oropharyngeal airway and neck mobility. Respiratory                         Examination: clear to auscultation. CV Examination:  normal. Prophylactic Antibiotics: The patient does not                         require prophylactic antibiotics. Prior                         Anticoagulants: The patient has taken no anticoagulant                          or antiplatelet agents. ASA Grade Assessment: III - A                         patient with severe systemic disease. After reviewing                         the risks and benefits, the patient was deemed in                         satisfactory condition to undergo the procedure. The                         anesthesia plan was to use monitored anesthesia care                         (MAC). Immediately prior to administration of                         medications, the patient was re-assessed for adequacy                         to receive sedatives. The heart rate, respiratory                         rate, oxygen saturations, blood pressure, adequacy of                         pulmonary ventilation, and response to care were                         monitored throughout the procedure. The physical                         status of the patient was re-assessed after the                         procedure.                        After obtaining informed consent, the colonoscope was                         passed under direct vision. Throughout the procedure,                         the patient's blood pressure, pulse, and oxygen                         saturations were monitored continuously. The  Colonoscope was introduced through the anus and                         advanced to the the terminal ileum. The colonoscopy                         was performed without difficulty. The patient                         tolerated the procedure well. The quality of the bowel                         preparation was good. The terminal ileum, ileocecal                         valve, appendiceal orifice, and rectum were                         photographed. Findings:      The perianal and digital rectal examinations were normal.      The terminal ileum appeared normal.      A 3 mm polyp was found in the ascending colon. The polyp was sessile.       The polyp was  removed with a cold snare. Resection and retrieval were       complete. Estimated blood loss was minimal.      Normal mucosa was found in the entire colon. Biopsies for histology were       taken with a cold forceps from the entire colon for evaluation of       microscopic colitis. Estimated blood loss was minimal.      Internal hemorrhoids were found during retroflexion. The hemorrhoids       were Grade I (internal hemorrhoids that do not prolapse).      The exam was otherwise without abnormality on direct and retroflexion       views. Impression:            - The examined portion of the ileum was normal.                        - One 3 mm polyp in the ascending colon, removed with                         a cold snare. Resected and retrieved.                        - Normal mucosa in the entire examined colon. Biopsied.                        - Internal hemorrhoids.                        - The examination was otherwise normal on direct and                         retroflexion views. Recommendation:        - Discharge patient to home.                        - Resume previous  diet.                        - Continue present medications.                        - Await pathology results.                        - Repeat colonoscopy in 7 years for surveillance.                        - Return to referring physician as previously                         scheduled. Procedure Code(s):     --- Professional ---                        [phone], Colonoscopy, flexible; with removal of                         tumor(s), polyp(s), or other lesion(s) by snare                         technique                        [id number], 7, Colonoscopy, flexible; with biopsy, single                         or multiple Diagnosis Code(s):     --- Professional ---                        K64.0, First degree hemorrhoids                        D12.2, Benign neoplasm of ascending colon                        R10.84,  Generalized abdominal pain                        K52.9, Noninfective gastroenteritis and colitis,                         unspecified CPT copyright 2022 American Medical Association. All rights reserved. The codes documented in this report are preliminary and upon coder review may  be revised to meet current compliance requirements. Andrey Farmer MD, MD 01/06/2022 1:38:34 PM Number of Addenda: 0 Note Initiated On: 01/06/2022 12:34 PM Scope Withdrawal Time: 0 hours 7 minutes 13 seconds  Total Procedure Duration: 0 hours 10 minutes 29 seconds  Estimated Blood Loss:  Estimated blood loss was minimal.      [facility]

## 2022-01-06 NOTE — Anesthesia Preprocedure Evaluation (Signed)
Anesthesia Evaluation  Patient identified by MRN, date of birth, ID band Patient awake    Reviewed: Allergy & Precautions, NPO status , Patient's Chart, lab work & pertinent test results  History of Anesthesia Complications (+) PONV and history of anesthetic complications  Airway Mallampati: III  TM Distance: >3 FB Neck ROM: full    Dental  (+) Chipped   Pulmonary neg shortness of breath, asthma    Pulmonary exam normal        Cardiovascular Exercise Tolerance: Good hypertension, (-) angina Normal cardiovascular exam     Neuro/Psych   Anxiety     negative neurological ROS  negative psych ROS   GI/Hepatic Neg liver ROS,GERD  Controlled,,  Endo/Other  negative endocrine ROS    Renal/GU negative Renal ROS  negative genitourinary   Musculoskeletal   Abdominal   Peds  Hematology negative hematology ROS (+)   Anesthesia Other Findings Past Medical History: No date: Acid reflux No date: Anxiety No date: Asthma No date: Asthma No date: Chlamydia No date: GERD (gastroesophageal reflux disease) No date: Hypertension No date: Irritable bowel syndrome No date: Obesity  Past Surgical History: 2014: CHOLECYSTECTOMY     Reproductive/Obstetrics negative OB ROS                             Anesthesia Physical Anesthesia Plan  ASA: 3  Anesthesia Plan: General   Post-op Pain Management:    Induction: Intravenous  PONV Risk Score and Plan: Propofol infusion and TIVA  Airway Management Planned: Natural Airway and Nasal Cannula  Additional Equipment:   Intra-op Plan:   Post-operative Plan:   Informed Consent: I have reviewed the patients History and Physical, chart, labs and discussed the procedure including the risks, benefits and alternatives for the proposed anesthesia with the patient or authorized representative who has indicated his/her understanding and acceptance.      Dental Advisory Given  Plan Discussed with: Anesthesiologist, CRNA and Surgeon  Anesthesia Plan Comments: (Patient consented for risks of anesthesia including but not limited to:  - adverse reactions to medications - risk of airway placement if required - damage to eyes, teeth, lips or other oral mucosa - nerve damage due to positioning  - sore throat or hoarseness - Damage to heart, brain, nerves, lungs, other parts of body or loss of life  Patient voiced understanding.)       Anesthesia Quick Evaluation

## 2022-01-06 NOTE — Interval H&P Note (Signed)
History and Physical Interval Note:  01/06/2022 12:45 PM  Becky Jackson  has presented today for surgery, with the diagnosis of Generalized abdominal pain; Dyspepsia; Diarrhea, unspecified type.  The various methods of treatment have been discussed with the patient and family. After consideration of risks, benefits and other options for treatment, the patient has consented to  Procedure(s): COLONOSCOPY WITH PROPOFOL (N/A) ESOPHAGOGASTRODUODENOSCOPY (EGD) WITH PROPOFOL (N/A) as a surgical intervention.  The patient's history has been reviewed, patient examined, no change in status, stable for surgery.  I have reviewed the patient's chart and labs.  Questions were answered to the patient's satisfaction.     [name]  Ok to proceed with EGD/Colonoscopy

## 2022-01-07 ENCOUNTER — Encounter: Payer: Self-pay | Admitting: Gastroenterology

## 2022-01-07 NOTE — Anesthesia Postprocedure Evaluation (Signed)
Anesthesia Post Note  Patient: Talbert Cage  Procedure(s) Performed: COLONOSCOPY WITH PROPOFOL ESOPHAGOGASTRODUODENOSCOPY (EGD) WITH PROPOFOL  Patient location during evaluation: Endoscopy Anesthesia Type: General Level of consciousness: awake and alert Pain management: pain level controlled Vital Signs Assessment: post-procedure vital signs reviewed and stable Respiratory status: spontaneous breathing, nonlabored ventilation, respiratory function stable and patient connected to nasal cannula oxygen Cardiovascular status: blood pressure returned to baseline and stable Postop Assessment: no apparent nausea or vomiting Anesthetic complications: no   No notable events documented.   Last Vitals:  Vitals:   01/06/22 1332 01/06/22 1352  BP: (!) 92/55 98/65  Pulse: (!) 56 62  Resp: (!) 22 19  Temp:    SpO2: 100% 100%    Last Pain:  Vitals:   01/07/22 0733  TempSrc:   PainSc: 0-No pain                 Precious Haws Sicilia Killough

## 2022-01-08 LAB — SURGICAL PATHOLOGY

## 2022-04-07 NOTE — Progress Notes (Unsigned)
Bubba Camp, FNP   No chief complaint on file.   HPI:      Ms. Becky Jackson is a 27 y.o. G1P0101 whose LMP was No LMP recorded. (Menstrual status: IUD)., presents today for ***  Has Mirena, placed 08/08/14. Has occas spotting with IUD. Concerned IUD is causing BV sx. Considering other [facility] but unsure of kind. Got pregnant on patch, increased headaches on pills, doesn't want wt gain with depo.  Never had pap  Patient Active Problem List   Diagnosis Date Noted   BV (bacterial vaginosis) 06/16/2016   Anemia 11/16/2012   Chronic cholecystitis 10/26/2012   Abdominal pain, other specified site 10/25/2012    Past Surgical History:  Procedure Laterality Date   CHOLECYSTECTOMY  2014   COLONOSCOPY WITH PROPOFOL N/A 01/06/2022   Procedure: COLONOSCOPY WITH PROPOFOL;  Surgeon: [name], MD;  Location: [facility] ENDOSCOPY;  Service: Endoscopy;  Laterality: N/A;   ESOPHAGOGASTRODUODENOSCOPY (EGD) WITH PROPOFOL N/A 01/06/2022   Procedure: ESOPHAGOGASTRODUODENOSCOPY (EGD) WITH PROPOFOL;  Surgeon: [name], MD;  Location: [facility] ENDOSCOPY;  Service: Endoscopy;  Laterality: N/A;    Family History  Problem Relation Age of Onset   Hypertension Mother    Diabetes Mother        DM Type 2    Social History   Socioeconomic History   Marital status: Single    Spouse name: Not on file   Number of children: Not on file   Years of education: Not on file   Highest education level: Not on file  Occupational History   Not on file  Tobacco Use   Smoking status: Never   Smokeless tobacco: Never  Vaping Use   Vaping Use: Never used  Substance and Sexual Activity   Alcohol use: Yes    Comment: occas   Drug use: Not Currently    Types: Marijuana   Sexual activity: Yes    Birth control/protection: I.U.D.    Comment: Mirena   Other Topics Concern   Not on file  Social History Narrative   Not on file   Social Determinants of Health   Financial Resource Strain:  Not on file  Food Insecurity: Not on file  Transportation Needs: Not on file  Physical Activity: Not on file  Stress: Not on file  Social Connections: Not on file  Intimate Partner Violence: Not on file    Outpatient Medications Prior to Visit  Medication Sig Dispense Refill   albuterol (PROVENTIL HFA;VENTOLIN HFA) 108 (90 Base) MCG/ACT inhaler Inhale 2 puffs into the lungs every 4 (four) hours as needed for wheezing or shortness of breath. 1 Inhaler 1   atenolol (TENORMIN) 25 MG tablet Take 25 mg by mouth daily. (Patient not taking: Reported on 01/06/2022)     levonorgestrel (MIRENA) 20 MCG/24HR IUD 1 each by Intrauterine route once.     metoCLOPramide (REGLAN) 10 MG tablet Take 1 tablet (10 mg total) by mouth every 6 (six) hours as needed for nausea. 20 tablet 0   ondansetron (ZOFRAN ODT) 4 MG disintegrating tablet Take 1 tablet (4 mg total) by mouth every 8 (eight) hours as needed for nausea or vomiting. 12 tablet 0   No facility-administered medications prior to visit.      ROS:  Review of Systems BREAST: No symptoms   OBJECTIVE:   Vitals:  There were no vitals taken for this visit.  Physical Exam  Results: No results found for this or any previous visit (from the past 24  hour(s)).   IUD Removal Strings of IUD identified and grasped.  IUD removed without problem with ring forceps.  Pt tolerated this well.  IUD noted to be intact.    Assessment/Plan: No diagnosis found.    No orders of the defined types were placed in this encounter.     No follow-ups on file.  Mylena Sedberry B. Katrese Shell, PA-C 04/07/2022 4:57 PM

## 2022-04-08 ENCOUNTER — Ambulatory Visit: Payer: Managed Care, Other (non HMO) | Admitting: Obstetrics and Gynecology

## 2022-04-08 ENCOUNTER — Encounter: Payer: Self-pay | Admitting: Obstetrics and Gynecology

## 2022-04-08 VITALS — BP 118/70 | Ht 61.0 in | Wt 168.0 lb

## 2022-04-08 DIAGNOSIS — Z124 Encounter for screening for malignant neoplasm of cervix: Secondary | ICD-10-CM

## 2022-04-08 DIAGNOSIS — Z113 Encounter for screening for infections with a predominantly sexual mode of transmission: Secondary | ICD-10-CM

## 2022-04-08 DIAGNOSIS — N760 Acute vaginitis: Secondary | ICD-10-CM

## 2022-04-08 DIAGNOSIS — B9689 Other specified bacterial agents as the cause of diseases classified elsewhere: Secondary | ICD-10-CM | POA: Diagnosis not present

## 2022-04-08 DIAGNOSIS — Z30432 Encounter for removal of intrauterine contraceptive device: Secondary | ICD-10-CM

## 2022-04-08 LAB — POCT WET PREP WITH KOH
Clue Cells Wet Prep HPF POC: POSITIVE
KOH Prep POC: POSITIVE — AB
Trichomonas, UA: NEGATIVE
Yeast Wet Prep HPF POC: NEGATIVE

## 2022-04-08 MED ORDER — METRONIDAZOLE 500 MG PO TABS: ORAL_TABLET | ORAL | 0 refills | Status: DC

## 2022-04-08 NOTE — Patient Instructions (Signed)
I value your feedback and you entrusting us with your care. If you get a [facility] patient survey, I would appreciate you taking the time to let us know about your experience today. Thank you! ? ? ?

## 2022-05-05 NOTE — Progress Notes (Deleted)
PCP:  Cleon Dew, FNP   No chief complaint on file.    HPI:      Ms. Becky Jackson is a 27 y.o. G1P0101 whose LMP was Patient's last menstrual period was 04/02/2022 (exact date)., presents today for her annual examination.  Her menses are {norm/abn:715}, lasting {number: 22536} days.  Dysmenorrhea {dysmen:716}. She {does:18564} have intermenstrual bleeding.  Sex activity: {sex active: 315163}.  Last Pap: {UEKC:003491791}  Results were: {norm/abn:16707::"no abnormalities"} /neg HPV DNA *** Hx of STDs: {STD hx:14358}  Hx of recurrent BV  Last mammogram: {date:304500300}  Results were: {norm/abn:13465} There is no FH of breast cancer. There is no FH of ovarian cancer. The patient {does:18564} do self-breast exams.  Tobacco use: {tob:20664} Alcohol use: {Alcohol:11675} No drug use.  Exercise: {exercise:31265}  She {does:18564} get adequate calcium and Vitamin D in her diet.  Patient Active Problem List   Diagnosis Date Noted   BV (bacterial vaginosis) 06/16/2016   Anemia 11/16/2012   Chronic cholecystitis 10/26/2012   Abdominal pain, other specified site 10/25/2012    Past Surgical History:  Procedure Laterality Date   CHOLECYSTECTOMY  2014   COLONOSCOPY WITH PROPOFOL N/A 01/06/2022   Procedure: COLONOSCOPY WITH PROPOFOL;  Surgeon: Regis Bill, MD;  Location: [facility] ENDOSCOPY;  Service: Endoscopy;  Laterality: N/A;   ESOPHAGOGASTRODUODENOSCOPY (EGD) WITH PROPOFOL N/A 01/06/2022   Procedure: ESOPHAGOGASTRODUODENOSCOPY (EGD) WITH PROPOFOL;  Surgeon: Regis Bill, MD;  Location: [facility] ENDOSCOPY;  Service: Endoscopy;  Laterality: N/A;    Family History  Problem Relation Age of Onset   Hypertension Mother    Diabetes Mother        DM Type 2    Social History   Socioeconomic History   Marital status: Single    Spouse name: Not on file   Number of children: Not on file   Years of education: Not on file   Highest education level: Not on file   Occupational History   Not on file  Tobacco Use   Smoking status: Never   Smokeless tobacco: Never  Vaping Use   Vaping Use: Never used  Substance and Sexual Activity   Alcohol use: Yes    Comment: occas   Drug use: Not Currently    Types: Marijuana   Sexual activity: Yes    Birth control/protection: I.U.D.    Comment: Mirena   Other Topics Concern   Not on file  Social History Narrative   Not on file   Social Determinants of Health   Financial Resource Strain: Not on file  Food Insecurity: Not on file  Transportation Needs: Not on file  Physical Activity: Not on file  Stress: Not on file  Social Connections: Not on file  Intimate Partner Violence: Not on file     Current Outpatient Medications:    albuterol (PROVENTIL HFA;VENTOLIN HFA) 108 (90 Base) MCG/ACT inhaler, Inhale 2 puffs into the lungs every 4 (four) hours as needed for wheezing or shortness of breath., Disp: 1 Inhaler, Rfl: 1   levonorgestrel (MIRENA) 20 MCG/24HR IUD, 1 each by Intrauterine route once., Disp: , Rfl:    metoCLOPramide (REGLAN) 10 MG tablet, Take 1 tablet (10 mg total) by mouth every 6 (six) hours as needed for nausea., Disp: 20 tablet, Rfl: 0   metroNIDAZOLE (FLAGYL) 500 MG tablet, Take 1 tab BID for 7 days; NO alcohol use for 10 days after prescription start, Disp: 14 tablet, Rfl: 0   ondansetron (ZOFRAN ODT) 4 MG disintegrating tablet, Take 1 tablet (  4 mg total) by mouth every 8 (eight) hours as needed for nausea or vomiting., Disp: 12 tablet, Rfl: 0     ROS:  Review of Systems BREAST: No symptoms   Objective: LMP 04/02/2022 (Exact Date)    OBGyn Exam  Results: No results found for this or any previous visit (from the past 24 hour(s)).  Assessment/Plan: No diagnosis found.  No orders of the defined types were placed in this encounter.            GYN counsel {counseling: 16159}     F/U  No follow-ups on file.  Becky Jackson B. Becky Perkey, PA-C 05/05/2022 8:30 PM

## 2022-05-06 ENCOUNTER — Ambulatory Visit: Payer: Managed Care, Other (non HMO) | Admitting: Obstetrics and Gynecology

## 2022-05-06 DIAGNOSIS — Z124 Encounter for screening for malignant neoplasm of cervix: Secondary | ICD-10-CM

## 2022-05-06 DIAGNOSIS — Z01419 Encounter for gynecological examination (general) (routine) without abnormal findings: Secondary | ICD-10-CM

## 2022-07-02 ENCOUNTER — Ambulatory Visit: Payer: Managed Care, Other (non HMO)

## 2022-07-02 VITALS — BP 137/74 | HR 91 | Wt 169.0 lb

## 2022-07-02 DIAGNOSIS — Z3200 Encounter for pregnancy test, result unknown: Secondary | ICD-10-CM

## 2022-07-02 DIAGNOSIS — Z3201 Encounter for pregnancy test, result positive: Secondary | ICD-10-CM

## 2022-07-02 DIAGNOSIS — N912 Amenorrhea, unspecified: Secondary | ICD-10-CM | POA: Diagnosis not present

## 2022-07-02 LAB — POCT URINE PREGNANCY: Preg Test, Ur: POSITIVE — AB

## 2022-07-02 MED ORDER — ONDANSETRON 4 MG PO TBDP: 4.0000 mg | ORAL_TABLET | Freq: Four times a day (QID) | ORAL | 0 refills | Status: DC | PRN

## 2022-07-02 NOTE — Progress Notes (Signed)
    NURSE VISIT NOTE  Subjective:    Patient ID: Becky Jackson, female    DOB: November 19, 1995, 27 y.o.   MRN: [id number]  HPI  Patient is a 27 y.o. G33P0101 female who presents for evaluation of amenorrhea. She believes she could be pregnant. Pregnancy is desired. Sexual Activity: single partner, contraception: none. Current symptoms also include: fatigue and positive home pregnancy test. Last period was normal.    Objective:    There were no vitals taken for this visit.    Assessment:   1. Possible pregnancy, not yet confirmed     Plan:   Pregnancy Test: Positive  Spoke with Claris Che she approved Zofran ODT for patient.     Burtis Junes, CMA

## 2022-07-07 ENCOUNTER — Ambulatory Visit: Payer: Managed Care, Other (non HMO)

## 2022-07-07 DIAGNOSIS — Z3689 Encounter for other specified antenatal screening: Secondary | ICD-10-CM

## 2022-07-07 DIAGNOSIS — Z0283 Encounter for blood-alcohol and blood-drug test: Secondary | ICD-10-CM

## 2022-07-07 DIAGNOSIS — Z3A09 9 weeks gestation of pregnancy: Secondary | ICD-10-CM

## 2022-07-07 DIAGNOSIS — Z113 Encounter for screening for infections with a predominantly sexual mode of transmission: Secondary | ICD-10-CM

## 2022-07-07 DIAGNOSIS — Z1379 Encounter for other screening for genetic and chromosomal anomalies: Secondary | ICD-10-CM

## 2022-07-07 DIAGNOSIS — Z3481 Encounter for supervision of other normal pregnancy, first trimester: Secondary | ICD-10-CM

## 2022-07-07 DIAGNOSIS — Z3480 Encounter for supervision of other normal pregnancy, unspecified trimester: Secondary | ICD-10-CM

## 2022-07-07 NOTE — Progress Notes (Signed)
New OB Intake  I connected with  Becky Jackson on 07/07/22 at 10:15 AM EDT by telephone Visit and verified that I am speaking with the correct person using two identifiers. Nurse is located at [facility] and pt is located at home.  I discussed the limitations, risks, security and privacy concerns of performing an evaluation and management service by telephone and the availability of in person appointments. I also discussed with the patient that there may be a patient responsible charge related to this service. The patient expressed understanding and agreed to proceed.  I explained I am completing New OB Intake today. We discussed her EDD of 1/11 that is based on LMP of 05/03/22. Pt is G2/P0101. I reviewed her allergies, medications, Medical/Surgical/OB history, and appropriate screenings. There are cats in the home in the home  no If yes Outdoor Based on history, this is a/an pregnancy uncomplicated .   Patient Active Problem List   Diagnosis Date Noted   BV (bacterial vaginosis) 06/16/2016   Anemia 11/16/2012   Chronic cholecystitis 10/26/2012   Abdominal pain, other specified site 10/25/2012    Concerns addressed today  Delivery Plans:  Plans to deliver [facility]  Anatomy US Explained first scheduled [place] will be 07/21/22 at 11:15 am.   Labs Discussed Natera genetic screening with patient. Patient would like genetic testing to be drawn at new OB visit. Discussed possible labs to be drawn at new OB appointment.  COVID Vaccine Patient has had COVID vaccine.   Social Determinants of Health Food Insecurity: denies food insecurity WIC Referral: Patient is interested in referral to [facility].  Transportation: Patient denies transportation needs. Childcare: Discussed no children allowed at ultrasound appointments.   First visit review I reviewed new OB appt with pt. I explained she will have ob bloodwork and pap smear/pelvic exam if indicated.NOB Physical will be  scheduled after dating ultrasound.   Loman Chroman, CMA 07/07/2022  10:29 AM

## 2022-07-21 ENCOUNTER — Ambulatory Visit: Payer: Managed Care, Other (non HMO)

## 2022-07-21 ENCOUNTER — Other Ambulatory Visit: Payer: Self-pay

## 2022-07-21 DIAGNOSIS — Z3A11 11 weeks gestation of pregnancy: Secondary | ICD-10-CM

## 2022-07-21 DIAGNOSIS — O3680X0 Pregnancy with inconclusive fetal viability, not applicable or unspecified: Secondary | ICD-10-CM

## 2022-08-04 ENCOUNTER — Encounter: Payer: Self-pay | Admitting: Obstetrics

## 2022-08-08 ENCOUNTER — Encounter: Payer: Self-pay | Admitting: Obstetrics

## 2022-08-08 ENCOUNTER — Ambulatory Visit (INDEPENDENT_AMBULATORY_CARE_PROVIDER_SITE_OTHER): Payer: Managed Care, Other (non HMO) | Admitting: Obstetrics

## 2022-08-08 ENCOUNTER — Other Ambulatory Visit (HOSPITAL_COMMUNITY)
Admission: RE | Admit: 2022-08-08 | Discharge: 2022-08-08 | Disposition: A | Discharge status: Home / Self Care | Payer: Managed Care, Other (non HMO) | Source: Ambulatory Visit | Attending: Obstetrics | Admitting: Obstetrics

## 2022-08-08 VITALS — BP 118/81 | HR 84 | Wt 167.6 lb

## 2022-08-08 DIAGNOSIS — Z3481 Encounter for supervision of other normal pregnancy, first trimester: Secondary | ICD-10-CM

## 2022-08-08 DIAGNOSIS — Z124 Encounter for screening for malignant neoplasm of cervix: Secondary | ICD-10-CM | POA: Diagnosis present

## 2022-08-08 DIAGNOSIS — Z3A13 13 weeks gestation of pregnancy: Secondary | ICD-10-CM | POA: Diagnosis not present

## 2022-08-08 DIAGNOSIS — Z113 Encounter for screening for infections with a predominantly sexual mode of transmission: Secondary | ICD-10-CM

## 2022-08-08 DIAGNOSIS — Z1379 Encounter for other screening for genetic and chromosomal anomalies: Secondary | ICD-10-CM

## 2022-08-08 DIAGNOSIS — Z0283 Encounter for blood-alcohol and blood-drug test: Secondary | ICD-10-CM

## 2022-08-08 DIAGNOSIS — Z3480 Encounter for supervision of other normal pregnancy, unspecified trimester: Secondary | ICD-10-CM | POA: Insufficient documentation

## 2022-08-08 LAB — OB RESULTS CONSOLE VARICELLA ZOSTER ANTIBODY, IGG: Varicella: IMMUNE

## 2022-08-08 NOTE — Progress Notes (Signed)
New Obstetric Patient H&P    Chief Complaint: "Desires prenatal care"   History of Present Illness: Patient is a 27 y.o. G2P0101 Not Hispanic or Latino female, LMP 05/03/22 presents with amenorrhea and positive home pregnancy test. Based on her  LMP, her EDD is Estimated Date of Delivery: 02/07/23 and her EGA is [redacted]w[redacted]d. Cycles are 6. days, regular, and occur approximately every : 28 days. Her last pap smear was 2 years ago and was no abnormalities.    She had a urine pregnancy test which was positive 5 or 6 week(s)  ago. Her last menstrual period was normal and lasted for  5 day(s). Since her LMP she claims she has experienced breast tenderness and some nausea which has subsided.. She denies vaginal bleeding. Her past medical history is noncontributory. Her prior pregnancies are notable for none  Since her LMP, she admits to the use of tobacco products  no She claims she has gained    10  pounds since the start of her pregnancy.  There are cats in the home in the home  no  She admits close contact with children on a regular basis  yes  She has had chicken pox in the past no She has had Tuberculosis exposures, symptoms, or previously tested positive for TB   no Current or past history of domestic violence. no  Genetic Screening/Teratology Counseling: (Includes patient, baby's father, or anyone in either family with:)   1. Patient's age >/= 33 at [facility]  no 2. Thalassemia ([place], [place], Mediterranean, or Asian background): MCV<80  no 3. Neural tube defect (meningomyelocele, spina bifida, anencephaly)  no 4. Congenital heart defect  no  5. Down syndrome  no 6. Tay-Sachs (Jewish, [place]  no 7. Canavan's Disease  no 8. Sickle cell disease or trait (African)  no  9. Hemophilia or other blood disorders  no  10. Muscular dystrophy  no  11. Cystic fibrosis  no  12. Huntington's Chorea  no  13. Mental retardation/autism  no 14. Other inherited genetic or chromosomal disorder   no 15. Maternal metabolic disorder (DM, PKU, etc)  no 16. Patient or FOB with a child with a birth defect not listed above no  16a. Patient or FOB with a birth defect themselves no 17. Recurrent pregnancy loss, or stillbirth  no  18. Any medications since LMP other than prenatal vitamins (include vitamins, supplements, OTC meds, drugs, alcohol)  no 19. Any other genetic/environmental exposure to discuss  no  Infection History:   1. Lives with someone with TB or TB exposed  no  2. Patient or partner has history of genital herpes  no 3. Rash or viral illness since LMP  no 4. History of STI (GC, CT, HPV, syphilis, HIV)  no 5. History of recent travel :  no  Other pertinent information:  no     Review of Systems:10 point review of systems negative unless otherwise noted in HPI  Past Medical History:  Past Medical History:  Diagnosis Date   Acid reflux    Anxiety    Asthma    Asthma    Chlamydia    GERD (gastroesophageal reflux disease)    Hypertension    Irritable bowel syndrome    Obesity     Past Surgical History:  Past Surgical History:  Procedure Laterality Date   CHOLECYSTECTOMY  2014   COLONOSCOPY WITH PROPOFOL N/A 01/06/2022   Procedure: COLONOSCOPY WITH PROPOFOL;  Surgeon: Regis Bill,  MD;  Location: [facility] ENDOSCOPY;  Service: Endoscopy;  Laterality: N/A;   ESOPHAGOGASTRODUODENOSCOPY (EGD) WITH PROPOFOL N/A 01/06/2022   Procedure: ESOPHAGOGASTRODUODENOSCOPY (EGD) WITH PROPOFOL;  Surgeon: Regis Bill, MD;  Location: [facility] ENDOSCOPY;  Service: Endoscopy;  Laterality: N/A;    Gynecologic History: Patient's last menstrual period was 05/03/2022.  Obstetric History: G2P0101  Family History:  Family History  Problem Relation Age of Onset   Hypertension Mother    Diabetes Mother        DM Type 2    Social History:  Social History   Socioeconomic History   Marital status: Single    Spouse name: Not on file   Number of children: Not on file    Years of education: Not on file   Highest education level: Not on file  Occupational History   Not on file  Tobacco Use   Smoking status: Never   Smokeless tobacco: Never  Vaping Use   Vaping status: Every Day   Substances: Nicotine  Substance and Sexual Activity   Alcohol use: Not Currently   Drug use: Not Currently    Types: Marijuana   Sexual activity: Yes    Birth control/protection: None  Other Topics Concern   Not on file  Social History Narrative   Not on file   Social Determinants of Health   Financial Resource Strain: Low Risk  (02/22/2021)   Received from [facility], [facility]   Overall Financial Resource Strain (CARDIA)    Difficulty of Paying Living Expenses: Not very hard  Food Insecurity: No Food Insecurity (02/22/2021)   [facility], [facility]   Hunger Vital Sign    Worried About Running Out of Food in the Last Year: Never true    Ran Out of Food in the Last Year: Never true  Transportation Needs: No Transportation Needs (02/22/2021)   Received from [facility], [facility]   [facility] - Transportation    Lack of Transportation (Medical): No    Lack of Transportation (Non-Medical): No  Physical Activity: Insufficiently Active (06/07/2020)   Received from [facility], [facility]   Exercise Vital Sign    Days of Exercise per Week: 3 days    Minutes of Exercise per Session: 30 min  Stress: Not on file  Social Connections: Not on file  Intimate Partner Violence: Not on file    Allergies:  Allergies  Allergen Reactions   Augmentin [Amoxicillin-Pot Clavulanate] Nausea And Vomiting    Medications: Prior to Admission medications   Medication Sig Start Date End Date Taking? Authorizing Provider  albuterol (PROVENTIL HFA;VENTOLIN HFA) 108 (90 Base) MCG/ACT inhaler Inhale 2 puffs into the lungs every 4 (four) hours as needed for wheezing or shortness of breath. 12/16/17  Yes Triplett, Cari B, FNP  ondansetron  (ZOFRAN-ODT) 4 MG disintegrating tablet Take 1 tablet (4 mg total) by mouth every 6 (six) hours as needed for nausea. 07/02/22  Yes Dominic, Courtney Heys, CNM  Prenatal Vit-Fe Fumarate-FA (MULTIVITAMIN-PRENATAL) 27-0.8 MG TABS tablet Take 1 tablet by mouth daily at 12 noon.   Yes [provider]    Physical Exam Vitals: Blood pressure 118/81, pulse 84, weight 167 lb 9.6 oz (76 kg), last menstrual period 05/03/2022.  General: NAD HEENT: normocephalic, anicteric Thyroid: no enlargement, no palpable nodules Pulmonary: No increased work of breathing, CTAB Cardiovascular: RRR, distal pulses 2+ Abdomen: NABS, soft, non-tender, non-distended.  Umbilicus without lesions.  No hepatomegaly, splenomegaly or masses  palpable. No evidence of hernia  Genitourinary:  External: Normal external female genitalia.  Normal urethral meatus, normal  Bartholin's and Skene's glands.    Vagina: Normal vaginal mucosa, no evidence of prolapse.    Cervix: Grossly normal in appearance, no bleeding  Uterus: anteverted, enlarged, mobile, normal contour.  No CMT  Adnexa: ovaries non-enlarged, no adnexal masses  Rectal: deferred Extremities: no edema, erythema, or tenderness Neurologic: Grossly intact Psychiatric: mood appropriate, affect full   Assessment: 27 y.o. G2P0101 at [redacted]w[redacted]d presenting to initiate prenatal care  Plan: 1) Avoid alcoholic beverages. 2) Patient encouraged not to smoke.  3) Discontinue the use of all non-medicinal drugs and chemicals.  4) Take prenatal vitamins daily.  5) Nutrition, food safety (fish, cheese advisories, and high nitrite foods) and exercise discussed. 6) Hospital and practice style discussed with cross coverage system.  7) Genetic Screening, such as with 1st Trimester Screening, cell free fetal DNA, AFP testing, and Ultrasound, as well as with amniocentesis and CVS as appropriate, is discussed with patient. At the conclusion of today's visit patient requested genetic  testing 8) Patient is asked about travel to areas at risk for the Zika virus, and counseled to avoid travel and exposure to mosquitoes or sexual partners who may have themselves been exposed to the virus. Testing is discussed, and will be ordered as appropriate.  Labwork drawn today. I have ordered her anatomy scan to be done in 5-7 weeks. Good FHTs heard today. She desires MaternT testing. She plans to breast feed.  Described the AOB practice model, use of CNMS with MD backup. F.U in 4 wks

## 2022-08-09 LAB — URINALYSIS, ROUTINE W REFLEX MICROSCOPIC
Bilirubin, UA: NEGATIVE
Glucose, UA: NEGATIVE
Ketones, UA: NEGATIVE
Leukocytes,UA: NEGATIVE
Nitrite, UA: NEGATIVE
Protein,UA: NEGATIVE
RBC, UA: NEGATIVE
Specific Gravity, UA: 1.022 (ref 1.005–1.030)
Urobilinogen, Ur: 0.2 mg/dL (ref 0.2–1.0)
pH, UA: 6.5 (ref 5.0–7.5)

## 2022-08-10 LAB — URINE CULTURE, OB REFLEX: Organism ID, Bacteria: NO GROWTH

## 2022-08-10 LAB — CULTURE, OB URINE

## 2022-08-11 ENCOUNTER — Other Ambulatory Visit: Payer: Self-pay | Admitting: Obstetrics

## 2022-08-11 ENCOUNTER — Encounter: Payer: Self-pay | Admitting: Obstetrics

## 2022-08-11 DIAGNOSIS — N760 Acute vaginitis: Secondary | ICD-10-CM

## 2022-08-11 LAB — CERVICOVAGINAL ANCILLARY ONLY
Bacterial Vaginitis (gardnerella): POSITIVE — AB
Candida Glabrata: NEGATIVE
Candida Vaginitis: NEGATIVE
Chlamydia: NEGATIVE
Comment: NEGATIVE
Comment: NEGATIVE
Comment: NEGATIVE
Comment: NEGATIVE
Comment: NEGATIVE
Comment: NORMAL
Neisseria Gonorrhea: NEGATIVE
Trichomonas: NEGATIVE

## 2022-08-11 LAB — URINE CYTOLOGY ANCILLARY ONLY
Chlamydia: NEGATIVE
Comment: NEGATIVE
Comment: NORMAL
Neisseria Gonorrhea: NEGATIVE

## 2022-08-11 MED ORDER — METRONIDAZOLE 500 MG PO TABS
500.0000 mg | ORAL_TABLET | Freq: Two times a day (BID) | ORAL | 0 refills | Status: AC
Start: 2022-08-11 — End: 2022-08-18

## 2022-08-11 NOTE — Progress Notes (Signed)
Aptima swab reveals BV. Rx for Flagyl sent to her pharmacy, and I have notified her via MyChart.  Mirna Mires, CNM  08/11/2022 4:56 PM

## 2022-08-12 LAB — CBC/D/PLT+RPR+RH+ABO+RUBIGG...
Antibody Screen: NEGATIVE
Basophils Absolute: 0 10*3/uL (ref 0.0–0.2)
Basos: 1 %
EOS (ABSOLUTE): 0.1 10*3/uL (ref 0.0–0.4)
Eos: 1 %
HCV Ab: NONREACTIVE
HIV Screen 4th Generation wRfx: NONREACTIVE
Hematocrit: 35.7 % (ref 34.0–46.6)
Hemoglobin: 12.5 g/dL (ref 11.1–15.9)
Hepatitis B Surface Ag: NEGATIVE
Immature Grans (Abs): 0 10*3/uL (ref 0.0–0.1)
Immature Granulocytes: 0 %
Lymphocytes Absolute: 2.5 10*3/uL (ref 0.7–3.1)
Lymphs: 32 %
MCH: 32.6 pg (ref 26.6–33.0)
MCHC: 35 g/dL (ref 31.5–35.7)
MCV: 93 fL (ref 79–97)
Monocytes Absolute: 0.4 10*3/uL (ref 0.1–0.9)
Monocytes: 5 %
Neutrophils Absolute: 4.8 10*3/uL (ref 1.4–7.0)
Neutrophils: 61 %
Platelets: 199 10*3/uL (ref 150–450)
RBC: 3.83 x10E6/uL (ref 3.77–5.28)
RDW: 11.9 % (ref 11.7–15.4)
RPR Ser Ql: NONREACTIVE
Rh Factor: POSITIVE
Rubella Antibodies, IGG: 1.76 index (ref >0.99)
Varicella zoster IgG: 166 index (ref >165)
WBC: 7.8 10*3/uL (ref 3.4–10.8)

## 2022-08-12 LAB — HEMOGLOBIN A1C
Est. average glucose Bld gHb Est-mCnc: 91 mg/dL
Hgb A1c MFr Bld: 4.8 % (ref 4.8–5.6)

## 2022-08-12 LAB — HGB FRACTIONATION CASCADE
Hgb A2: 2.8 % (ref 1.8–3.2)
Hgb A: 97.2 % (ref 96.4–98.8)
Hgb F: 0 % (ref 0.0–2.0)
Hgb S: 0 %

## 2022-08-12 LAB — HCV INTERPRETATION

## 2022-08-14 LAB — CYTOLOGY - PAP: Diagnosis: NEGATIVE

## 2022-08-16 LAB — MATERNIT 21 PLUS CORE, BLOOD
Fetal Fraction: 6
Result (T21): NEGATIVE
Trisomy 13 (Patau syndrome): NEGATIVE
Trisomy 18 (Edwards syndrome): NEGATIVE
Trisomy 21 (Down syndrome): NEGATIVE

## 2022-09-08 ENCOUNTER — Ambulatory Visit (INDEPENDENT_AMBULATORY_CARE_PROVIDER_SITE_OTHER): Payer: Managed Care, Other (non HMO) | Admitting: Obstetrics

## 2022-09-08 VITALS — BP 117/79 | HR 100 | Wt 171.0 lb

## 2022-09-08 DIAGNOSIS — Z3689 Encounter for other specified antenatal screening: Secondary | ICD-10-CM

## 2022-09-08 DIAGNOSIS — N760 Acute vaginitis: Secondary | ICD-10-CM

## 2022-09-08 DIAGNOSIS — B9689 Other specified bacterial agents as the cause of diseases classified elsewhere: Secondary | ICD-10-CM

## 2022-09-08 DIAGNOSIS — Z3480 Encounter for supervision of other normal pregnancy, unspecified trimester: Secondary | ICD-10-CM

## 2022-09-08 LAB — POCT URINALYSIS DIPSTICK OB
Bilirubin, UA: NEGATIVE
Blood, UA: NEGATIVE
Glucose, UA: NEGATIVE
Ketones, UA: NEGATIVE
Leukocytes, UA: NEGATIVE
Nitrite, UA: NEGATIVE
POC,PROTEIN,UA: NEGATIVE
Spec Grav, UA: 1.015 (ref 1.010–1.025)
Urobilinogen, UA: 0.2 E.U./dL
pH, UA: 6 (ref 5.0–8.0)

## 2022-09-08 NOTE — Addendum Note (Signed)
Addended by: Loney Laurence on: 09/08/2022 03:24 PM   Modules accepted: Orders

## 2022-09-08 NOTE — Assessment & Plan Note (Addendum)
-  Encouraged chiropractor, abdominal support belt, stretches for sciatic/hip pain. -Completed course of metronidazole. BV symptoms resolved. TOC at next visit. -Anatomy scan ordered. -Anticipatory guidance about the second trimester. Encouraged physical activity.

## 2022-09-08 NOTE — Progress Notes (Signed)
    Return Prenatal Note   Assessment/Plan   Plan  27 y.o. G2P0101 at [redacted]w[redacted]d presents for follow-up OB visit. Reviewed prenatal record including previous visit note.  Supervision of other normal pregnancy, antepartum -Starting to feel flutters. -Encouraged chiropractor, abdominal support belt, stretches for sciatic/hip pain. -Completed course of metronidazole. BV symptoms resolved -Anatomy scan ordered. -Anticipatory guidance about the second trimester. Encouraged physical activity.    Orders Placed This Encounter  Procedures   US OB Comp + 14 Wk    Standing Status:   Future    Standing Expiration Date:   09/08/2023    Order Specific Question:   Reason for Exam (SYMPTOM  OR DIAGNOSIS REQUIRED)    Answer:   anatomy scan    Order Specific Question:   Preferred Imaging Location?    Answer:   Internal   Return in about 4 weeks (around 10/06/2022).   No future appointments.  For next visit:  continue with routine prenatal care. Anatomy scan in 2 weeks.     Subjective   27 y.o. G2P0101 at [redacted]w[redacted]d presents for this follow-up prenatal visit.  Becky Jackson is feeling less nauseated now. She is starting to feel flutters. She is having pain in her left hip that goes down to her knee and some pelvic discomfort.  Movement: Present Contractions: Not present  Objective   Flow sheet Vitals: Pulse Rate: 100 BP: 117/79 Fetal Heart Rate (bpm): 147 Total weight gain: 4 lb (1.814 kg)  General Appearance  No acute distress, well appearing, and well nourished Pulmonary   Normal work of breathing Neurologic   Alert and oriented to person, place, and time Psychiatric   Mood and affect within normal limits  Becky Jackson, CNM 09/08/22 10:46 AM

## 2022-09-22 ENCOUNTER — Ambulatory Visit: Payer: Managed Care, Other (non HMO)

## 2022-09-22 DIAGNOSIS — Z3689 Encounter for other specified antenatal screening: Secondary | ICD-10-CM

## 2022-09-22 DIAGNOSIS — Z3A20 20 weeks gestation of pregnancy: Secondary | ICD-10-CM

## 2022-10-02 ENCOUNTER — Encounter: Payer: Self-pay | Admitting: Obstetrics

## 2022-10-06 ENCOUNTER — Ambulatory Visit (INDEPENDENT_AMBULATORY_CARE_PROVIDER_SITE_OTHER): Payer: Managed Care, Other (non HMO) | Admitting: Advanced Practice Midwife

## 2022-10-06 VITALS — BP 120/70 | HR 95 | Wt 188.3 lb

## 2022-10-06 DIAGNOSIS — Z3A22 22 weeks gestation of pregnancy: Secondary | ICD-10-CM

## 2022-10-06 DIAGNOSIS — Z3482 Encounter for supervision of other normal pregnancy, second trimester: Secondary | ICD-10-CM

## 2022-10-06 LAB — POCT URINALYSIS DIPSTICK
Bilirubin, UA: NEGATIVE
Blood, UA: NEGATIVE
Glucose, UA: NEGATIVE
Ketones, UA: NEGATIVE
Leukocytes, UA: NEGATIVE
Nitrite, UA: NEGATIVE
Protein, UA: NEGATIVE
Spec Grav, UA: 1.015 (ref 1.010–1.025)
Urobilinogen, UA: 0.2 U/dL
pH, UA: 6.5 (ref 5.0–8.0)

## 2022-10-06 NOTE — Progress Notes (Signed)
Routine Prenatal Care Visit  Subjective  Becky Jackson is a 27 y.o. G2P0101 at [redacted]w[redacted]d being seen today for ongoing prenatal care.  She is currently monitored for the following issues for this low-risk pregnancy and has Abdominal pain, other specified site; Chronic cholecystitis; Anemia; BV (bacterial vaginosis); and Supervision of other normal pregnancy, antepartum on their problem list.  ----------------------------------------------------------------------------------- Patient reports allergy symptoms. She lost her NOB packet in a recent move. Reviewed safe meds and added list to her AVS.   Contractions: Not present. Vag. Bleeding: None.  Movement: Present. Leaking Fluid denies.  ----------------------------------------------------------------------------------- The following portions of the patient's history were reviewed and updated as appropriate: allergies, current medications, past family history, past medical history, past social history, past surgical history and problem list. Problem list updated.  Objective  Blood pressure 120/70, pulse 95, weight 188 lb 4.8 oz (85.4 kg), last menstrual period 05/03/2022. Pregravid weight 167 lb (75.8 kg) Total Weight Gain 21 lb 4.8 oz (9.662 kg) Urinalysis: Urine Protein    Urine Glucose    Fetal Status: Fetal Heart Rate (bpm): 143 Fundal Height: 22 cm Movement: Present     General:  Alert, oriented and cooperative. Patient is in no acute distress.  Skin: Skin is warm and dry. No rash noted.   Cardiovascular: Normal heart rate noted  Respiratory: Normal respiratory effort, no problems with respiration noted  Abdomen: Soft, gravid, appropriate for gestational age. Pain/Pressure: Absent     Pelvic:  Cervical exam deferred        Extremities: Normal range of motion.  Edema: None  Mental Status: Normal mood and affect. Normal behavior. Normal judgment and thought content.   Assessment   27 y.o. G2P0101 at [redacted]w[redacted]d by  02/07/2023, by Last Menstrual  Period presenting for routine prenatal visit  Plan    Nursing Staff Provider  Office Location  [facility] Dating  By LMP  Language  English Anatomy US  Complete/normal  Flu Vaccine   Genetic Screen  NIPS: negative/female  TDaP vaccine    Hgb A1C or  GTT Early : Hgb A1C 4.8 Third trimester :   Covid    LAB RESULTS   Rhogam   Blood Type O/Positive/-- (07/12 1059)   Feeding Plan  Antibody Negative (07/12 1059)  Contraception  Rubella 1.76 (07/12 1059)  Circumcision  RPR Non Reactive (07/12 1059)   Pediatrician   HBsAg Negative (07/12 1059)   Support Person  HIV Non Reactive (07/12 1059)  Prenatal Classes  Varicella     GBS  (For PCN allergy, check sensitivities)   BTL Consent     VBAC Consent  Pap  08/08/22 negative    Hgb Electro      CF      SMA           Preterm labor symptoms and general obstetric precautions including but not limited to vaginal bleeding, contractions, leaking of fluid and fetal movement were reviewed in detail with the patient. Please refer to After Visit Summary for other counseling recommendations.   Return in about 4 weeks (around 11/03/2022) for rob.  Tresea Mall, CNM 10/06/2022 10:44 AM

## 2022-10-06 NOTE — Patient Instructions (Signed)

## 2022-11-03 ENCOUNTER — Encounter: Payer: Self-pay | Admitting: Licensed Practical Nurse

## 2022-11-03 ENCOUNTER — Ambulatory Visit (INDEPENDENT_AMBULATORY_CARE_PROVIDER_SITE_OTHER): Payer: Managed Care, Other (non HMO) | Admitting: Licensed Practical Nurse

## 2022-11-03 VITALS — BP 122/71 | HR 88 | Wt 203.2 lb

## 2022-11-03 DIAGNOSIS — Z131 Encounter for screening for diabetes mellitus: Secondary | ICD-10-CM

## 2022-11-03 DIAGNOSIS — Z3482 Encounter for supervision of other normal pregnancy, second trimester: Secondary | ICD-10-CM

## 2022-11-03 DIAGNOSIS — O26843 Uterine size-date discrepancy, third trimester: Secondary | ICD-10-CM

## 2022-11-03 DIAGNOSIS — Z0289 Encounter for other administrative examinations: Secondary | ICD-10-CM

## 2022-11-03 DIAGNOSIS — Z3A26 26 weeks gestation of pregnancy: Secondary | ICD-10-CM

## 2022-11-03 LAB — POCT URINALYSIS DIPSTICK
Bilirubin, UA: NEGATIVE
Blood, UA: NEGATIVE
Glucose, UA: NEGATIVE
Ketones, UA: NEGATIVE
Leukocytes, UA: NEGATIVE
Nitrite, UA: NEGATIVE
Protein, UA: NEGATIVE
Spec Grav, UA: 1.02 (ref 1.010–1.025)
Urobilinogen, UA: 0.2 U/dL
pH, UA: 5 (ref 5.0–8.0)

## 2022-11-03 NOTE — Progress Notes (Signed)
Routine Prenatal Care Visit  Subjective  Becky Jackson is a 27 y.o. G2P0101 at [redacted]w[redacted]d being seen today for ongoing prenatal care.  She is currently monitored for the following issues for this low-risk pregnancy and has Abdominal pain, other specified site; Chronic cholecystitis; Anemia; BV (bacterial vaginosis); and Supervision of other normal pregnancy, antepartum on their problem list.  ----------------------------------------------------------------------------------- Patient reports fatigue.   Doing well. Mood is good She has an 27 year old at home, she breastfeed for 3 months, would like to breastfeed for the first year, already has a pump. -recently moved into a new house, has a baby shower in Dec -SD [place] ordered  -reviewed GDM screening at next visit   Contractions: Not present. Vag. Bleeding: None.  Movement: Present. Leaking Fluid denies.  ----------------------------------------------------------------------------------- The following portions of the patient's history were reviewed and updated as appropriate: allergies, current medications, past family history, past medical history, past social history, past surgical history and problem list. Problem list updated.  Objective  Blood pressure 122/71, pulse 88, weight 203 lb 3.2 oz (92.2 kg), last menstrual period 05/03/2022. Pregravid weight 167 lb (75.8 kg) Total Weight Gain 36 lb 3.2 oz (16.4 kg) Urinalysis: Urine Protein    Urine Glucose    Fetal Status: Fetal Heart Rate (bpm): 125 Fundal Height: 29 cm Movement: Present     General:  Alert, oriented and cooperative. Patient is in no acute distress.  Skin: Skin is warm and dry. No rash noted.   Cardiovascular: Normal heart rate noted  Respiratory: Normal respiratory effort, no problems with respiration noted  Abdomen: Soft, gravid, appropriate for gestational age. Pain/Pressure: Absent     Pelvic:  Cervical exam deferred        Extremities: Normal range of motion.  Edema: Trace   Mental Status: Normal mood and affect. Normal behavior. Normal judgment and thought content.   Assessment   27 y.o. G2P0101 at [redacted]w[redacted]d by  02/07/2023, by Last Menstrual Period presenting for routine prenatal visit  Plan   G2 Problems (from 07/07/22 to present)     Problem Noted Resolved   Supervision of other normal pregnancy, antepartum 08/08/2022 by Mirna Mires, CNM No   Overview Addendum 10/06/2022 10:47 AM by Tresea Mall, CNM     Nursing Staff Provider  Office Location  [facility] Dating  By LMP  Language  English Anatomy US  Complete/normal  Flu Vaccine   Genetic Screen  NIPS: negative/female  TDaP vaccine    Hgb A1C or  GTT Early : Hgb A1C 4.8 Third trimester :   Covid    LAB RESULTS   Rhogam   Blood Type O/Positive/-- (07/12 1059)   Feeding Plan  Antibody Negative (07/12 1059)  Contraception  Rubella 1.76 (07/12 1059)  Circumcision  RPR Non Reactive (07/12 1059)   Pediatrician   HBsAg Negative (07/12 1059)   Support Person  HIV Non Reactive (07/12 1059)  Prenatal Classes  Varicella     GBS  (For PCN allergy, check sensitivities)   BTL Consent     VBAC Consent  Pap  08/08/22 negative    Hgb Electro      CF      SMA                    Preterm labor symptoms and general obstetric precautions including but not limited to vaginal bleeding, contractions, leaking of fluid and fetal movement were reviewed in detail with the patient. Please refer to After Visit Summary  for other counseling recommendations.   Return in about 2 weeks (around 11/17/2022) for ROB, 28wk labs .  Carie Caddy, CNM  [facility]  11/03/22  4:23 PM

## 2022-11-12 ENCOUNTER — Ambulatory Visit
Admission: RE | Admit: 2022-11-12 | Discharge: 2022-11-12 | Disposition: A | Discharge status: Home / Self Care | Payer: Managed Care, Other (non HMO) | Source: Ambulatory Visit | Attending: Licensed Practical Nurse | Admitting: Licensed Practical Nurse

## 2022-11-12 DIAGNOSIS — O26843 Uterine size-date discrepancy, third trimester: Secondary | ICD-10-CM | POA: Insufficient documentation

## 2022-11-12 DIAGNOSIS — O321XX0 Maternal care for breech presentation, not applicable or unspecified: Secondary | ICD-10-CM | POA: Insufficient documentation

## 2022-11-12 DIAGNOSIS — O3662X0 Maternal care for excessive fetal growth, second trimester, not applicable or unspecified: Secondary | ICD-10-CM | POA: Insufficient documentation

## 2022-11-12 DIAGNOSIS — Z3482 Encounter for supervision of other normal pregnancy, second trimester: Secondary | ICD-10-CM | POA: Insufficient documentation

## 2022-11-12 DIAGNOSIS — Z3A27 27 weeks gestation of pregnancy: Secondary | ICD-10-CM | POA: Diagnosis not present

## 2022-11-12 DIAGNOSIS — O26842 Uterine size-date discrepancy, second trimester: Secondary | ICD-10-CM | POA: Diagnosis not present

## 2022-11-17 ENCOUNTER — Ambulatory Visit: Payer: Managed Care, Other (non HMO)

## 2022-11-17 ENCOUNTER — Other Ambulatory Visit: Payer: Managed Care, Other (non HMO)

## 2022-11-17 VITALS — BP 113/74 | HR 101 | Wt 208.9 lb

## 2022-11-17 DIAGNOSIS — Z3A28 28 weeks gestation of pregnancy: Secondary | ICD-10-CM

## 2022-11-17 DIAGNOSIS — Z131 Encounter for screening for diabetes mellitus: Secondary | ICD-10-CM

## 2022-11-17 DIAGNOSIS — O99213 Obesity complicating pregnancy, third trimester: Secondary | ICD-10-CM | POA: Insufficient documentation

## 2022-11-17 DIAGNOSIS — Z6830 Body mass index (BMI) 30.0-30.9, adult: Secondary | ICD-10-CM | POA: Insufficient documentation

## 2022-11-17 DIAGNOSIS — Z3482 Encounter for supervision of other normal pregnancy, second trimester: Secondary | ICD-10-CM

## 2022-11-17 DIAGNOSIS — Z8751 Personal history of pre-term labor: Secondary | ICD-10-CM | POA: Insufficient documentation

## 2022-11-17 DIAGNOSIS — Z23 Encounter for immunization: Secondary | ICD-10-CM | POA: Diagnosis not present

## 2022-11-17 DIAGNOSIS — Z3480 Encounter for supervision of other normal pregnancy, unspecified trimester: Secondary | ICD-10-CM

## 2022-11-17 NOTE — Assessment & Plan Note (Signed)
-   One hour glucola, third trimester labs, Tdap, and BTFC completed today. 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 Note provided per patient request stating safety of flu vaccine in pregnancy. - Discussed birth planning. Had epidural with first. Is planning on waiting and seeing what happens with this one, but open to epidural if needed.  - Patient states baby was breech on most recent US. We discussed how this can be common at the beginning of the third trimester but most babies will flip to be head down by 36-37 weeks. Provided with link to [facility].  - Reviewed kick counts and preterm labor warning signs. Instructed to call office or come to hospital with persistent headache, vision changes, regular contractions, leaking of fluid, decreased fetal movement or vaginal bleeding.

## 2022-11-17 NOTE — Progress Notes (Signed)
    Return Prenatal Note   Assessment/Plan   Plan  27 y.o. G2P0101 at [redacted]w[redacted]d presents for follow-up OB visit. Reviewed prenatal record including previous visit note.  BMI 30.0-30.9,adult - BMI now 39, most likely will be above 40 in third trimester. Recent growth ultrasound, report not available at this time.   Supervision of other normal pregnancy, antepartum - One hour glucola, third trimester labs, Tdap, and BTFC completed today. 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 Note provided per patient request stating safety of flu vaccine in pregnancy. - Discussed birth planning. Had epidural with first. Is planning on waiting and seeing what happens with this one, but open to epidural if needed.  - Patient states baby was breech on most recent US. We discussed how this can be common at the beginning of the third trimester but most babies will flip to be head down by 36-37 weeks. Provided with link to [facility].  - Reviewed kick counts and preterm labor warning signs. Instructed to call office or come to hospital with persistent headache, vision changes, regular contractions, leaking of fluid, decreased fetal movement or vaginal bleeding.   Orders Placed This Encounter  Procedures   Tdap vaccine greater than or equal to 7yo IM   Return in about 2 weeks (around 12/01/2022) for ROB.   No future appointments.  For next visit:  continue with routine prenatal care     Subjective   27 y.o. G2P0101 at [redacted]w[redacted]d presents for this follow-up prenatal visit.  Patient has questions about breech presentation. Patient reports: Movement: Present Contractions: Not present  Objective   Flow sheet Vitals: Pulse Rate: (!) 101 BP: 113/74 Fundal Height: 28 cm Fetal Heart Rate (bpm): 140 Total weight gain: 41 lb 14.4 oz (19 kg)  General Appearance  No acute distress, well appearing, and well nourished Pulmonary   Normal work of breathing Neurologic   Alert and oriented to  person, place, and time Psychiatric   Mood and affect within normal limits  Lindalou Hose Aliyana Dlugosz, CNM  10/21/248:59 AM

## 2022-11-17 NOTE — Assessment & Plan Note (Addendum)
-   BMI now 39, most likely will be above 40 in third trimester. Recent growth ultrasound, report not available at this time.

## 2022-11-18 LAB — 28 WEEK RH+PANEL
Basophils Absolute: 0 10*3/uL (ref 0.0–0.2)
Basos: 0 %
EOS (ABSOLUTE): 0.1 10*3/uL (ref 0.0–0.4)
Eos: 1 %
Gestational Diabetes Screen: 106 mg/dL (ref 70–139)
HIV Screen 4th Generation wRfx: NONREACTIVE
Hematocrit: 36.5 % (ref 34.0–46.6)
Hemoglobin: 11.9 g/dL (ref 11.1–15.9)
Immature Grans (Abs): 0.1 10*3/uL (ref 0.0–0.1)
Immature Granulocytes: 1 %
Lymphocytes Absolute: 2.4 10*3/uL (ref 0.7–3.1)
Lymphs: 25 %
MCH: 32 pg (ref 26.6–33.0)
MCHC: 32.6 g/dL (ref 31.5–35.7)
MCV: 98 fL — ABNORMAL HIGH (ref 79–97)
Monocytes Absolute: 0.5 10*3/uL (ref 0.1–0.9)
Monocytes: 5 %
Neutrophils Absolute: 6.5 10*3/uL (ref 1.4–7.0)
Neutrophils: 68 %
Platelets: 218 10*3/uL (ref 150–450)
RBC: 3.72 x10E6/uL — ABNORMAL LOW (ref 3.77–5.28)
RDW: 11.2 % — ABNORMAL LOW (ref 11.7–15.4)
RPR Ser Ql: NONREACTIVE
WBC: 9.5 10*3/uL (ref 3.4–10.8)

## 2022-12-01 ENCOUNTER — Encounter: Payer: Self-pay | Admitting: Licensed Practical Nurse

## 2022-12-01 ENCOUNTER — Ambulatory Visit (INDEPENDENT_AMBULATORY_CARE_PROVIDER_SITE_OTHER): Payer: Managed Care, Other (non HMO) | Admitting: Licensed Practical Nurse

## 2022-12-01 VITALS — BP 133/79 | HR 93 | Wt 209.6 lb

## 2022-12-01 DIAGNOSIS — Z3A30 30 weeks gestation of pregnancy: Secondary | ICD-10-CM

## 2022-12-01 DIAGNOSIS — Z3480 Encounter for supervision of other normal pregnancy, unspecified trimester: Secondary | ICD-10-CM

## 2022-12-01 LAB — POCT URINALYSIS DIPSTICK
Bilirubin, UA: NEGATIVE
Blood, UA: NEGATIVE
Glucose, UA: NEGATIVE
Ketones, UA: NEGATIVE
Leukocytes, UA: NEGATIVE
Nitrite, UA: NEGATIVE
Protein, UA: NEGATIVE
Spec Grav, UA: 1.015 (ref 1.010–1.025)
Urobilinogen, UA: 0.2 U/dL
pH, UA: 7 (ref 5.0–8.0)

## 2022-12-01 NOTE — Progress Notes (Signed)
Routine Prenatal Care Visit  Subjective  Becky Jackson is a 27 y.o. G2P0101 at [redacted]w[redacted]d being seen today for ongoing prenatal care.  She is currently monitored for the following issues for this low-risk pregnancy and has Chronic cholecystitis; BV (bacterial vaginosis); Supervision of other normal pregnancy, antepartum; BMI 30.0-30.9,adult; and History of preterm delivery on their problem list.  ----------------------------------------------------------------------------------- Patient reports fatigue, heartburn, sleep disturbances -has an 27 year old, he has a 37,84,27 year old. They have a lot of family nearby for support. -Fiance and best friend for labor support. -unsure about future pregnancies, would,like an IUD  -TWG 42 lbs, is very active at work and with her daughter. Eats a generally healthy diet , gained 60lbs I her last pregnancy   Contractions: Not present. Vag. Bleeding: None.  Movement: Present. Leaking Fluid denies.  ----------------------------------------------------------------------------------- The following portions of the patient's history were reviewed and updated as appropriate: allergies, current medications, past family history, past medical history, past social history, past surgical history and problem list. Problem list updated.  Objective  Blood pressure 133/79, pulse 93, weight 209 lb 9.6 oz (95.1 kg), last menstrual period 05/03/2022. Pregravid weight 167 lb (75.8 kg) Total Weight Gain 42 lb 9.6 oz (19.3 kg) Urinalysis: Urine Protein    Urine Glucose    Fetal Status:     Movement: Present     General:  Alert, oriented and cooperative. Patient is in no acute distress.  Skin: Skin is warm and dry. No rash noted.   Cardiovascular: Normal heart rate noted  Respiratory: Normal respiratory effort, no problems with respiration noted  Abdomen: Soft, gravid, appropriate for gestational age. Pain/Pressure: Present     Pelvic:  Cervical exam deferred        Extremities:  Normal range of motion.  Edema: None  Mental Status: Normal mood and affect. Normal behavior. Normal judgment and thought content.   Assessment   27 y.o. G2P0101 at [redacted]w[redacted]d by  02/07/2023, by Last Menstrual Period presenting for routine prenatal visit  Plan   G2 Problems (from 07/07/22 to present)     Problem Noted Resolved   BMI 30.0-30.9,adult 11/17/2022 by Free, Lindalou Hose, CNM No   Overview Signed 11/17/2022  8:37 AM by Burney Gauze, CNM    First/Second Trimester BMI 30-39 [ ]  Baseline PIH labs, thyroid, HgbA1c [ ]  Early 1 hour glucola [ ]  Daily 81 mg ASA at 12 weeks with additional risk factor [ ]  Discuss weight goals < 20 lbs TWG [ ]  Detailed anatomy US  Third Trimester, if BMI now >40 [ ]  Repeat one hour glucola at 28 weeks [ ]  Growth [place] q4 weeks starting at 28 weeks [ ]  Anesthesia consult with BMI > 50 [ ]  Weekly NSTs beginning at 36 weeks [ ]  Consider induction by 41 weeks       History of preterm delivery 11/17/2022 by Burney Gauze, CNM No   Overview Addendum 11/17/2022  8:56 AM by Burney Gauze, CNM    G1 at 36 weeks with PPROM      Supervision of other normal pregnancy, antepartum 08/08/2022 by Mirna Mires, CNM No   Overview Addendum 11/17/2022  8:56 AM by Burney Gauze, CNM     Nursing Staff Provider  Office Location  [facility] Dating  By LMP  Language  English Anatomy US  Complete/normal  Flu Vaccine  At work Genetic Screen  NIPS: negative/female  TDaP vaccine   10/21 Hgb A1C or  GTT Early :  Hgb A1C 4.8 Third trimester :   Covid    LAB RESULTS   Rhogam   Blood Type O/Positive/-- (07/12 1059)   Feeding Plan  Antibody Negative (07/12 1059)  Contraception  Rubella 1.76 (07/12 1059)  Circumcision  RPR Non Reactive (07/12 1059)   Pediatrician   HBsAg Negative (07/12 1059)   Support Person  HIV Non Reactive (07/12 1059)  Prenatal Classes  Varicella immune    GBS  (For PCN allergy, check sensitivities)   BTL Consent     VBAC Consent  Pap  08/08/22 negative     Hgb Electro      CF      SMA                    Preterm labor symptoms and general obstetric precautions including but not limited to vaginal bleeding, contractions, leaking of fluid and fetal movement were reviewed in detail with the patient. Please refer to After Visit Summary for other counseling recommendations.   Return in about 2 weeks (around 12/15/2022) for ROB. Carie Caddy, CNM  [facility]  12/01/22  9:39 AM  '

## 2022-12-15 ENCOUNTER — Ambulatory Visit (INDEPENDENT_AMBULATORY_CARE_PROVIDER_SITE_OTHER): Payer: Managed Care, Other (non HMO) | Admitting: Obstetrics

## 2022-12-15 VITALS — BP 111/71 | HR 93 | Wt 219.6 lb

## 2022-12-15 DIAGNOSIS — Z3A32 32 weeks gestation of pregnancy: Secondary | ICD-10-CM

## 2022-12-15 DIAGNOSIS — Z3480 Encounter for supervision of other normal pregnancy, unspecified trimester: Secondary | ICD-10-CM

## 2022-12-15 DIAGNOSIS — O26843 Uterine size-date discrepancy, third trimester: Secondary | ICD-10-CM

## 2022-12-15 LAB — POCT URINALYSIS DIPSTICK OB
Bilirubin, UA: NEGATIVE
Blood, UA: NEGATIVE
Glucose, UA: NEGATIVE
Ketones, UA: NEGATIVE
Leukocytes, UA: NEGATIVE
Nitrite, UA: NEGATIVE
POC,PROTEIN,UA: NEGATIVE
Spec Grav, UA: 1.025 (ref 1.010–1.025)
Urobilinogen, UA: 0.2 U/dL
pH, UA: 5 (ref 5.0–8.0)

## 2022-12-15 NOTE — Progress Notes (Signed)
Routine Prenatal Care Visit  Subjective  Becky Jackson is a 27 y.o. G2P0101 at [redacted]w[redacted]d being seen today for ongoing prenatal care.  She is currently monitored for the following issues for this low-risk pregnancy and has Chronic cholecystitis; BV (bacterial vaginosis); Supervision of other normal pregnancy, antepartum; BMI 30.0-30.9,adult; and History of preterm delivery on their problem list.  ----------------------------------------------------------------------------------- Patient reports no complaints.  She works at [employer]. Occasional ankle and pedal edema. Does hav compression stockings. Has named her baby [facility]. Contractions: Not present. Vag. Bleeding: None.  Movement: Present. Leaking Fluid denies.  ----------------------------------------------------------------------------------- The following portions of the patient's history were reviewed and updated as appropriate: allergies, current medications, past family history, past medical history, past social history, past surgical history and problem list. Problem list updated.  Objective  Blood pressure 111/71, pulse 93, weight 219 lb 9.6 oz (99.6 kg), last menstrual period 05/03/2022. Pregravid weight 167 lb (75.8 kg) Total Weight Gain 52 lb 9.6 oz (23.9 kg) Urinalysis: Urine Protein    Urine Glucose    Fetal Status:     Movement: Present     General:  Alert, oriented and cooperative. Patient is in no acute distress.  Skin: Skin is warm and dry. No rash noted.   Cardiovascular: Normal heart rate noted  Respiratory: Normal respiratory effort, no problems with respiration noted  Abdomen: Soft, gravid, appropriate for gestational age. Pain/Pressure: Present     Pelvic:  Cervical exam deferred        Extremities: Normal range of motion.  Edema: Trace  Mental Status: Normal mood and affect. Normal behavior. Normal judgment and thought content.   Assessment   27 y.o. G2P0101 at [redacted]w[redacted]d by  02/07/2023, by Last Menstrual  Period presenting for routine prenatal visit Measures large for dates 52 lb wt gain this pregnancy Plan   G2 Problems (from 07/07/22 to present)     Problem Noted Resolved   BMI 30.0-30.9,adult 11/17/2022 by Free, Lindalou Hose, CNM No   Overview Signed 11/17/2022  8:37 AM by Burney Gauze, CNM    First/Second Trimester BMI 30-39 [ ]  Baseline PIH labs, thyroid, HgbA1c [ ]  Early 1 hour glucola [ ]  Daily 81 mg ASA at 12 weeks with additional risk factor [ ]  Discuss weight goals < 20 lbs TWG [ ]  Detailed anatomy US  Third Trimester, if BMI now >40 [ ]  Repeat one hour glucola at 28 weeks [ ]  Growth [place] q4 weeks starting at 28 weeks [ ]  Anesthesia consult with BMI > 50 [ ]  Weekly NSTs beginning at 36 weeks [ ]  Consider induction by 41 weeks       History of preterm delivery 11/17/2022 by Burney Gauze, CNM No   Overview Addendum 11/17/2022  8:56 AM by Burney Gauze, CNM    G1 at 36 weeks with PPROM      Supervision of other normal pregnancy, antepartum 08/08/2022 by Mirna Mires, CNM No   Overview Addendum 12/15/2022  8:19 AM by Tommie Raymond, CMA     Nursing Staff Provider  Office Location  [facility] Dating  By LMP  Language  English Anatomy US  Complete/normal  Flu Vaccine  At work Genetic Screen  NIPS: negative/female  TDaP vaccine   10/21 Hgb A1C or  GTT Early : Hgb A1C 4.8 Third trimester :   Covid    LAB RESULTS   Rhogam  N/A Blood Type O/Positive/-- (07/12 1059)   Feeding Plan breast Antibody Negative (  07/12 1059)  Contraception IUD Rubella 1.76 (07/12 1059)  Circumcision yes RPR Non Reactive (10/21 0941)   Pediatrician  [facility] HBsAg Negative (07/12 1059)   Support Person Levada Schilling HIV Non Reactive (10/21 0941)  Prenatal Classes none Varicella immune    GBS  (For PCN allergy, check sensitivities)   BTL Consent     VBAC Consent  Pap  08/08/22 negative    Hgb Electro      CF      SMA                    Preterm labor symptoms and general  obstetric precautions including but not limited to vaginal bleeding, contractions, leaking of fluid and fetal movement were reviewed in detail with the patient. Please refer to After Visit Summary for other counseling recommendations.   Return in about 2 weeks (around 12/29/2022) for return OB. Growth scan ordered for 36 wks due to S>Dates  Mirna Mires, CNM  12/15/2022 8:35 AM

## 2022-12-17 ENCOUNTER — Encounter: Payer: Self-pay | Admitting: Obstetrics

## 2022-12-29 ENCOUNTER — Ambulatory Visit (INDEPENDENT_AMBULATORY_CARE_PROVIDER_SITE_OTHER): Payer: Managed Care, Other (non HMO) | Admitting: Obstetrics

## 2022-12-29 VITALS — BP 105/72 | HR 109 | Wt 223.0 lb

## 2022-12-29 DIAGNOSIS — E669 Obesity, unspecified: Secondary | ICD-10-CM

## 2022-12-29 DIAGNOSIS — Z3480 Encounter for supervision of other normal pregnancy, unspecified trimester: Secondary | ICD-10-CM

## 2022-12-29 DIAGNOSIS — Z23 Encounter for immunization: Secondary | ICD-10-CM

## 2022-12-29 DIAGNOSIS — O99213 Obesity complicating pregnancy, third trimester: Secondary | ICD-10-CM

## 2022-12-29 DIAGNOSIS — Z2911 Encounter for prophylactic immunotherapy for respiratory syncytial virus (RSV): Secondary | ICD-10-CM

## 2022-12-29 DIAGNOSIS — Z3A34 34 weeks gestation of pregnancy: Secondary | ICD-10-CM

## 2022-12-29 NOTE — Progress Notes (Signed)
    Return Prenatal Note   Subjective  27 y.o. G2P0101 at [redacted]w[redacted]d presents for this follow-up prenatal visit.  Patient has been coughing since 11/30. No fevers or vomiting. Would like to defer RSV vaccine to next visit.   Patient reports:  Movement: Present Contractions: Not present Denies vaginal bleeding or leaking fluid. Objective  Flow sheet Vitals: Pulse Rate: (!) 109 BP: 105/72 Fundal Height: 35 cm Fetal Heart Rate (bpm): 128 Total weight gain: 56 lb (25.4 kg)  General Appearance  No acute distress, well appearing, and well nourished Pulmonary   Normal work of breathing Neurologic   Alert and oriented to person, place, and time Psychiatric   Mood and affect within normal limits  Assessment/Plan   Plan  27 y.o. G2P0101 at [redacted]w[redacted]d by LMP=11wk [place] presents for follow-up OB visit. Reviewed prenatal record including previous visit note.  1. Supervision of other normal pregnancy, antepartum -After discussion, pt would like RSV vaccine today  2. Obesity affecting pregnancy in third trimester, unspecified obesity type -Growth scan ordered by CNM at last visit for S>D, is measuring appropriate today, but will keep growth [place] at 36wks for BMI -Start NSTs at 36wks -Discussed IOL by 40wks if not delivered prior  Return in about 2 weeks (around 01/12/2023) for ROB w/NST.   Future Appointments  Date Time Provider Department Center  01/12/2023  9:15 AM AOB-NST ROOM [facility] None  01/12/2023  9:55 AM Free, Lindalou Hose, CNM [facility] None  01/14/2023 10:15 AM AOB-AOB [place] 1 AOB-IMG None    For next visit:  ROB with GBS screening , NST, and growth [place]    Julieanne Manson, DO [facility]

## 2023-01-12 ENCOUNTER — Ambulatory Visit (INDEPENDENT_AMBULATORY_CARE_PROVIDER_SITE_OTHER): Payer: Managed Care, Other (non HMO)

## 2023-01-12 ENCOUNTER — Other Ambulatory Visit (HOSPITAL_COMMUNITY)
Admission: RE | Admit: 2023-01-12 | Discharge: 2023-01-12 | Disposition: A | Discharge status: Home / Self Care | Payer: Managed Care, Other (non HMO) | Source: Ambulatory Visit

## 2023-01-12 VITALS — BP 111/72 | HR 100 | Wt 227.9 lb

## 2023-01-12 VITALS — BP 111/72 | HR 100 | Ht 61.0 in | Wt 227.9 lb

## 2023-01-12 DIAGNOSIS — Z3A36 36 weeks gestation of pregnancy: Secondary | ICD-10-CM | POA: Diagnosis not present

## 2023-01-12 DIAGNOSIS — O99213 Obesity complicating pregnancy, third trimester: Secondary | ICD-10-CM

## 2023-01-12 DIAGNOSIS — Z113 Encounter for screening for infections with a predominantly sexual mode of transmission: Secondary | ICD-10-CM

## 2023-01-12 DIAGNOSIS — E669 Obesity, unspecified: Secondary | ICD-10-CM | POA: Diagnosis not present

## 2023-01-12 DIAGNOSIS — Z3493 Encounter for supervision of normal pregnancy, unspecified, third trimester: Secondary | ICD-10-CM | POA: Insufficient documentation

## 2023-01-12 DIAGNOSIS — Z3685 Encounter for antenatal screening for Streptococcus B: Secondary | ICD-10-CM

## 2023-01-12 DIAGNOSIS — Z3480 Encounter for supervision of other normal pregnancy, unspecified trimester: Secondary | ICD-10-CM

## 2023-01-12 MED ORDER — ONDANSETRON 4 MG PO TBDP: 4.0000 mg | ORAL_TABLET | Freq: Four times a day (QID) | ORAL | 2 refills | Status: DC | PRN

## 2023-01-12 NOTE — Assessment & Plan Note (Addendum)
-   RNST today, growth ultrasound on 12/18.  - Reviewed plan for weekly NSTs and consideration of delivery by 40 weeks.

## 2023-01-12 NOTE — Progress Notes (Addendum)
    Return Prenatal Note   Assessment/Plan   Plan  27 y.o. G2P0101 at [redacted]w[redacted]d presents for follow-up OB visit. Reviewed prenatal record including previous visit note.  Obesity affecting pregnancy in third trimester - RNST today, growth ultrasound on 12/18.  - Reviewed plan for weekly NSTs and consideration of delivery by 40 weeks.   Supervision of other normal pregnancy, antepartum - GBS and GC/CT screening swabs collected today. - Provided with resources for encouraging timely labor.  - Reviewed labor warning signs and expectations for birth. Instructed to call office or come to hospital with persistent headache, vision changes, regular contractions, leaking of fluid, decreased fetal movement or vaginal bleeding.    Orders Placed This Encounter  Procedures   Strep Gp B NAA   Return in about 1 week (around 01/19/2023) for ROB with NST.   Future Appointments  Date Time Provider Department Center  01/14/2023 10:15 AM AOB-AOB [place] 1 AOB-IMG None    For next visit:   ROB with NST     Subjective   27 y.o. G2P0101 at [redacted]w[redacted]d presents for this follow-up prenatal visit.  Patient feeling more pressure, nausea has started again.  Patient reports: Movement: Present Contractions: Irritability  Objective   Flow sheet Vitals: Pulse Rate: 100 BP: 111/72 Fundal Height: 36 cm Fetal Heart Rate (bpm): RNST Presentation: Vertex Total weight gain: 60 lb 14.4 oz (27.6 kg)  General Appearance  No acute distress, well appearing, and well nourished Pulmonary   Normal work of breathing Neurologic   Alert and oriented to person, place, and time Psychiatric   Mood and affect within normal limits  Lindalou Hose Anquinette Pierro, CNM  01/11/2409:02 AM

## 2023-01-12 NOTE — Assessment & Plan Note (Addendum)
-   GBS and GC/CT screening swabs collected today. - Provided with resources for encouraging timely labor.  - Provided with refill on Zofran for nausea. - Reviewed labor warning signs and expectations for birth. Instructed to call office or come to hospital with persistent headache, vision changes, regular contractions, leaking of fluid, decreased fetal movement or vaginal bleeding.

## 2023-01-12 NOTE — Addendum Note (Signed)
Addended by: Autumn Messing on: 01/12/2023 11:00 AM   Modules accepted: Orders

## 2023-01-12 NOTE — Patient Instructions (Signed)

## 2023-01-12 NOTE — Progress Notes (Signed)
    NURSE VISIT NOTE  Subjective:    Patient ID: Martin Majestic, female    DOB: 03-01-1995, 27 y.o.   MRN: [id number]  HPI  Patient is a 27 y.o. G6P0101 female who presents for fetal monitoring per order from Julieanne Manson, MD.   Objective:    BP 111/72   Pulse 100   Ht 5\' 1"  (1.549 m)   Wt 227 lb 14.4 oz (103.4 kg)   LMP 05/03/2022   BMI 43.06 kg/m  Estimated Date of Delivery: 02/07/23  [redacted]w[redacted]d  Fetus A Non-Stress Test Interpretation for 01/12/23  Indication: Obesity  Fetal Heart Rate A Mode: External Baseline Rate (A): 135 bpm Variability: Moderate Accelerations: 15 x 15 Decelerations: None Multiple birth?: No  Uterine Activity Mode: Toco Contraction Frequency (min): None  Interpretation (Fetal Testing) Nonstress Test Interpretation: Reactive Overall Impression: Reassuring for gestational age   Assessment:   1. Obesity affecting pregnancy in third trimester, unspecified obesity type   2. [redacted] weeks gestation of pregnancy      Plan:   Results reviewed and discussed with patient by  Autumn Messing, CNM.     Rocco Serene, LPN

## 2023-01-13 LAB — CERVICOVAGINAL ANCILLARY ONLY
Chlamydia: NEGATIVE
Comment: NEGATIVE
Comment: NORMAL
Neisseria Gonorrhea: NEGATIVE

## 2023-01-14 ENCOUNTER — Ambulatory Visit: Payer: Managed Care, Other (non HMO)

## 2023-01-14 DIAGNOSIS — O26843 Uterine size-date discrepancy, third trimester: Secondary | ICD-10-CM

## 2023-01-14 DIAGNOSIS — Z3A36 36 weeks gestation of pregnancy: Secondary | ICD-10-CM | POA: Diagnosis not present

## 2023-01-14 DIAGNOSIS — Z3480 Encounter for supervision of other normal pregnancy, unspecified trimester: Secondary | ICD-10-CM

## 2023-01-14 LAB — STREP GP B NAA: Strep Gp B NAA: NEGATIVE

## 2023-01-19 ENCOUNTER — Ambulatory Visit (INDEPENDENT_AMBULATORY_CARE_PROVIDER_SITE_OTHER): Payer: Managed Care, Other (non HMO)

## 2023-01-19 ENCOUNTER — Ambulatory Visit: Payer: Managed Care, Other (non HMO)

## 2023-01-19 VITALS — BP 121/79 | HR 90 | Wt 234.9 lb

## 2023-01-19 VITALS — BP 121/79 | HR 90 | Ht 61.0 in | Wt 234.9 lb

## 2023-01-19 DIAGNOSIS — Z3480 Encounter for supervision of other normal pregnancy, unspecified trimester: Secondary | ICD-10-CM

## 2023-01-19 DIAGNOSIS — O99213 Obesity complicating pregnancy, third trimester: Secondary | ICD-10-CM | POA: Diagnosis not present

## 2023-01-19 DIAGNOSIS — E669 Obesity, unspecified: Secondary | ICD-10-CM

## 2023-01-19 DIAGNOSIS — Z3A37 37 weeks gestation of pregnancy: Secondary | ICD-10-CM

## 2023-01-19 DIAGNOSIS — Z3A39 39 weeks gestation of pregnancy: Secondary | ICD-10-CM | POA: Diagnosis not present

## 2023-01-19 LAB — POCT URINALYSIS DIPSTICK OB
Bilirubin, UA: NEGATIVE
Blood, UA: NEGATIVE
Glucose, UA: NEGATIVE
Ketones, UA: NEGATIVE
Nitrite, UA: NEGATIVE
POC,PROTEIN,UA: NEGATIVE
Spec Grav, UA: <=1.005 — AB (ref 1.010–1.025)
Urobilinogen, UA: 0.2 U/dL
pH, UA: 6.5 (ref 5.0–8.0)

## 2023-01-19 NOTE — Assessment & Plan Note (Signed)
Reactive NST today 

## 2023-01-19 NOTE — Progress Notes (Unsigned)
    NURSE VISIT NOTE  Subjective:    Patient ID: Becky Jackson, female    DOB: 1995-03-28, 27 y.o.   MRN: [id number]  HPI  Patient is a 27 y.o. G64P0101 female who presents for fetal monitoring per order from Autumn Messing, [place].   Objective:    BP 121/79   Pulse 90   Wt 234 lb 14.4 oz (106.5 kg)   LMP 05/03/2022   BMI 44.38 kg/m  Estimated Date of Delivery: 02/07/23  [redacted]w[redacted]d  Fetus A Non-Stress Test Interpretation for 01/19/23  Indication: Obesity  Fetal Heart Rate A Mode: External Baseline Rate (A): 135 bpm Variability: Moderate Accelerations: 15 x 15 Decelerations: None Multiple birth?: No  Uterine Activity Mode: Toco Contraction Frequency (min): None  Interpretation (Fetal Testing) Nonstress Test Interpretation: Reactive Overall Impression: Reassuring for gestational age   Assessment:   1. Obesity affecting pregnancy in third trimester, unspecified obesity type   2. [redacted] weeks gestation of pregnancy      Plan:   Results reviewed and discussed with patient by  Autumn Messing, CNM.     Rocco Serene, LPN

## 2023-01-19 NOTE — Patient Instructions (Signed)

## 2023-01-19 NOTE — Assessment & Plan Note (Signed)
-   Will plan to schedule IOL at next visit for between 39-40 weeks due to BMI.  - Reviewed labor warning signs and expectations for birth. Instructed to call office or come to hospital with persistent headache, vision changes, regular contractions, leaking of fluid, decreased fetal movement or vaginal bleeding.

## 2023-01-19 NOTE — Progress Notes (Signed)
    Return Prenatal Note   Assessment/Plan   Plan  27 y.o. G2P0101 at [redacted]w[redacted]d presents for follow-up OB visit. Reviewed prenatal record including previous visit note.  Supervision of other normal pregnancy, antepartum - Will plan to schedule IOL at next visit for between 39-40 weeks due to BMI.  - Reviewed labor warning signs and expectations for birth. Instructed to call office or come to hospital with persistent headache, vision changes, regular contractions, leaking of fluid, decreased fetal movement or vaginal bleeding.   Obesity affecting pregnancy in third trimester - Reactive NST today.    Orders Placed This Encounter  Procedures   POC Urinalysis Dipstick OB   Return in about 1 week (around 01/26/2023) for ROB.   Future Appointments  Date Time Provider Department Center  01/19/2023  2:55 PM Shyniece Scripter, Lindalou Hose, CNM [facility] None    For next visit:  continue with routine prenatal care     Subjective   27 y.o. G2P0101 at [redacted]w[redacted]d presents for this follow-up prenatal visit.  Patient has no concerns. Feeling a little more tired, but overall doing well.  Patient reports: Movement: Present Contractions: Irregular  Objective   Flow sheet Vitals: Pulse Rate: 90 BP: 121/79 Fundal Height: 37 cm Fetal Heart Rate (bpm): RNST Presentation: Vertex Total weight gain: 67 lb 14.4 oz (30.8 kg)  General Appearance  No acute distress, well appearing, and well nourished Pulmonary   Normal work of breathing Neurologic   Alert and oriented to person, place, and time Psychiatric   Mood and affect within normal limits  Lindalou Hose Taheerah Guldin, CNM  12/23/242:53 PM

## 2023-01-27 ENCOUNTER — Ambulatory Visit (INDEPENDENT_AMBULATORY_CARE_PROVIDER_SITE_OTHER): Payer: Managed Care, Other (non HMO)

## 2023-01-27 ENCOUNTER — Encounter: Payer: Self-pay | Admitting: Obstetrics

## 2023-01-27 ENCOUNTER — Ambulatory Visit (INDEPENDENT_AMBULATORY_CARE_PROVIDER_SITE_OTHER): Payer: Managed Care, Other (non HMO) | Admitting: Obstetrics

## 2023-01-27 VITALS — BP 110/73 | HR 108 | Ht 61.0 in | Wt 235.0 lb

## 2023-01-27 DIAGNOSIS — Z3A38 38 weeks gestation of pregnancy: Secondary | ICD-10-CM | POA: Insufficient documentation

## 2023-01-27 DIAGNOSIS — Z3480 Encounter for supervision of other normal pregnancy, unspecified trimester: Secondary | ICD-10-CM

## 2023-01-27 DIAGNOSIS — E669 Obesity, unspecified: Secondary | ICD-10-CM

## 2023-01-27 DIAGNOSIS — O99213 Obesity complicating pregnancy, third trimester: Secondary | ICD-10-CM | POA: Diagnosis not present

## 2023-01-27 NOTE — Progress Notes (Signed)
    Return Prenatal Note   Assessment/Plan   Plan  27 y.o. G2P0101 at [redacted]w[redacted]d presents for follow-up OB visit. Reviewed prenatal record including previous visit note.  Obesity affecting pregnancy in third trimester -RNST today -IOL scheduled for 02/06/23 @ 0900 ([redacted]w[redacted]d)  Supervision of other normal pregnancy, antepartum -Reviewed s/s of labor and when to go to the hospital -Discussed activities to encourage fetal engagement in the pelvis   No orders of the defined types were placed in this encounter.  Return in about 1 week (around 02/03/2023).   Future Appointments  Date Time Provider Department Center  02/03/2023  2:15 PM AOB-NST ROOM [facility] None  02/03/2023  2:55 PM Leigh Sober, MD [facility] None    For next visit:  ROB with NST    Subjective  Becky Jackson is ready for labor. She has been having some runs of contractions.  Movement: Present Contractions: Not present  Objective   Flow sheet Vitals: Pulse Rate: (!) 108 BP: 110/73 Fetal Heart Rate (bpm): see NST Dilation: Fingertip Effacement (%): Thick Station: Ballotable Total weight gain: 68 lb (30.8 kg)  General Appearance  No acute distress, well appearing, and well nourished Pulmonary   Normal work of breathing Neurologic   Alert and oriented to person, place, and time Psychiatric   Mood and affect within normal limits  Eleanor Canny, CNM 01/27/23 4:42 PM

## 2023-01-27 NOTE — Patient Instructions (Signed)

## 2023-01-27 NOTE — Assessment & Plan Note (Signed)
-  RNST today -IOL scheduled for 02/06/23 @ 0900 ([redacted]w[redacted]d)

## 2023-01-27 NOTE — Assessment & Plan Note (Addendum)
-  Reviewed s/s of labor and when to go to the hospital -Discussed activities to encourage fetal engagement in the pelvis

## 2023-01-27 NOTE — Progress Notes (Signed)
    NURSE VISIT NOTE  Subjective:    Patient ID: [name], female    DOB: Nov 11, 1995, 27 y.o.   MRN: [id number]  HPI  Patient is a 27 y.o. G22P0101 female who presents for fetal monitoring per order from Lauraine Range, [place].   Objective:    BP 110/73   Pulse (!) 108   Ht 5' 1 (1.549 m)   Wt 235 lb (106.6 kg)   LMP 05/03/2022   BMI 44.40 kg/m  Estimated Date of Delivery: 02/07/23  [redacted]w[redacted]d  Fetus A Non-Stress Test Interpretation for 01/27/23  Indication: Obesity  Fetal Heart Rate A Mode: External Baseline Rate (A): 135 bpm Variability: Moderate Accelerations: 15 x 15 Decelerations: None Multiple birth?: No  Uterine Activity Mode: Toco Contraction Frequency (min): None  Interpretation (Fetal Testing) Nonstress Test Interpretation: Reactive Overall Impression: Reassuring for gestational age   Assessment:   1. Obesity affecting pregnancy in third trimester, unspecified obesity type   2. [redacted] weeks gestation of pregnancy      Plan:   Results reviewed and discussed with patient by  Eleanor Canny, CNM.     Camelia Bars, LPN

## 2023-01-28 NOTE — L&D Delivery Note (Signed)
 Date of delivery: 02/07/23 Estimated Date of Delivery: 02/07/23 EGA: [redacted]w[redacted]d  Hospital Course summary: LORECE KEACH is a 28 y.o. [id number] @ [redacted]w[redacted]d who was admitted on 02/06/2023 for IOL secondary to elevated BMI.  Delivery Note At 6:13 AM a viable female was delivered via Vaginal, Spontaneous (Presentation:   Occiput Anterior).  APGAR: 9, 9; weight  pending.   Placenta status: Spontaneous, Intact.  Cord: 3 vessels with the following complications: None.    Anesthesia: Epidural Episiotomy: None Lacerations: None Suture Repair:  NA Est. Blood Loss (mL): 350  Mom to postpartum.  Baby to Couplet care / Skin to Skin.  Lolita Loots 02/07/2023, 6:44 AM    Delivery Narrative  [name] was complete at 0500 and began pushing at 0500. She delivered a vigorous female infant named Bodie at [phone]. Apgars 9,9. The head followed by shoulders which were delivered without difficulty, and the rest of the body. Baby was immediately placed skin to skin on mother's chest. Baby had a double nuchal cord which was reduced before the birth of the body. Delayed cord clamping with cord clamped by CNM and cut by FOB. Cord blood was obtained. Placenta was delivered at 0621, primarily by maternal efforts and with some slight cord traction. Placenta was intact, Shultz mechanism, 3 VC. Perineum inspected and found to be intact. Well tolerated by patient. AMSTL with IV pitocin  per unit protocol. EBL 350. Mother and baby stable.

## 2023-02-03 ENCOUNTER — Ambulatory Visit: Payer: Managed Care, Other (non HMO)

## 2023-02-03 ENCOUNTER — Encounter: Payer: Self-pay | Admitting: Obstetrics

## 2023-02-03 ENCOUNTER — Ambulatory Visit: Payer: Managed Care, Other (non HMO) | Admitting: Obstetrics

## 2023-02-03 VITALS — BP 127/77 | HR 93 | Wt 235.0 lb

## 2023-02-03 VITALS — BP 127/77 | HR 93 | Ht 61.0 in | Wt 235.0 lb

## 2023-02-03 DIAGNOSIS — Z3A39 39 weeks gestation of pregnancy: Secondary | ICD-10-CM

## 2023-02-03 DIAGNOSIS — O99213 Obesity complicating pregnancy, third trimester: Secondary | ICD-10-CM | POA: Diagnosis not present

## 2023-02-03 DIAGNOSIS — E669 Obesity, unspecified: Secondary | ICD-10-CM

## 2023-02-03 NOTE — Progress Notes (Signed)
    NURSE VISIT NOTE  Subjective:    Patient ID: Becky Jackson, female    DOB: 1995-06-10, 28 y.o.   MRN: [id number]  HPI  Patient is a 28 y.o. G29P0101 female who presents for fetal monitoring per order from Eleanor Canny, CNM.   Objective:    BP 127/77   Pulse 93   Ht 5' 1 (1.549 m)   Wt 235 lb (106.6 kg)   LMP 05/03/2022   BMI 44.40 kg/m  Estimated Date of Delivery: 02/07/23  [redacted]w[redacted]d  Fetus A Non-Stress Test Interpretation for 02/03/23  Indication: Obesity  Fetal Heart Rate A Mode: External Baseline Rate (A): 125 bpm Variability: Moderate Accelerations: 15 x 15 Decelerations: None Multiple birth?: No  Uterine Activity Mode: Toco Contraction Frequency (min): None  Interpretation (Fetal Testing) Nonstress Test Interpretation: Reactive Overall Impression: Reassuring for gestational age   Assessment:   1. Obesity affecting pregnancy in third trimester, unspecified obesity type   2. [redacted] weeks gestation of pregnancy      Plan:   Results reviewed and discussed with patient by  Estil Mangle, MD.     Camelia Bars, LPN

## 2023-02-03 NOTE — Progress Notes (Signed)
    Return Prenatal Note   Subjective  28 y.o. G2P0101 at [redacted]w[redacted]d presents for this follow-up prenatal visit. Pregnancy notable for  maternal obesity.   Patient request cervical check. NST done prior to visit. Feeling well, excited for IOL coming up.   Patient reports: Movement: Present Contractions: Irregular Denies vaginal bleeding or leaking fluid. Objective  Flow sheet Vitals: Pulse Rate: 93 BP: 127/77 Fundal Height: 39 cm Fetal Heart Rate (bpm): 120s Dilation: Fingertip Effacement (%): 50 Station: -3 Total weight gain: 68 lb (30.8 kg)  General Appearance  No acute distress, well appearing, and well nourished Pulmonary   Normal work of breathing Neurologic   Alert and oriented to person, place, and time Psychiatric   Mood and affect within normal limits  Assessment/Plan   Plan  28 y.o. G2P0101 at [redacted]w[redacted]d by LMP=11wk US  presents for follow-up OB visit. Reviewed prenatal record including previous visit note.  1. Obesity affecting pregnancy in third trimester, unspecified obesity type (Primary) -Reactive NST today -IOL scheduled for 02/06/23 at 9a ([redacted]w[redacted]d); expectations and plan of care discussed -Labor precautions reviewed -Last prenatal visit     Estil Mangle, DO [facility]

## 2023-02-03 NOTE — Patient Instructions (Signed)

## 2023-02-04 ENCOUNTER — Encounter: Payer: Self-pay | Admitting: Obstetrics

## 2023-02-06 ENCOUNTER — Other Ambulatory Visit: Payer: Self-pay

## 2023-02-06 ENCOUNTER — Encounter: Payer: Self-pay | Admitting: Obstetrics

## 2023-02-06 ENCOUNTER — Inpatient Hospital Stay
Admission: RE | Admit: 2023-02-06 | Discharge: 2023-02-09 | DRG: 807 | Disposition: A | Discharge status: Home / Self Care | Payer: Managed Care, Other (non HMO) | Source: Ambulatory Visit | Attending: Family | Admitting: Family

## 2023-02-06 DIAGNOSIS — K219 Gastro-esophageal reflux disease without esophagitis: Secondary | ICD-10-CM | POA: Diagnosis present

## 2023-02-06 DIAGNOSIS — O9962 Diseases of the digestive system complicating childbirth: Secondary | ICD-10-CM | POA: Diagnosis present

## 2023-02-06 DIAGNOSIS — Z3480 Encounter for supervision of other normal pregnancy, unspecified trimester: Principal | ICD-10-CM

## 2023-02-06 DIAGNOSIS — Z3A39 39 weeks gestation of pregnancy: Secondary | ICD-10-CM

## 2023-02-06 DIAGNOSIS — Z3A40 40 weeks gestation of pregnancy: Secondary | ICD-10-CM | POA: Diagnosis not present

## 2023-02-06 DIAGNOSIS — E66813 Obesity, class 3: Secondary | ICD-10-CM | POA: Diagnosis present

## 2023-02-06 DIAGNOSIS — Z9049 Acquired absence of other specified parts of digestive tract: Secondary | ICD-10-CM

## 2023-02-06 DIAGNOSIS — Z8751 Personal history of pre-term labor: Secondary | ICD-10-CM

## 2023-02-06 DIAGNOSIS — O99213 Obesity complicating pregnancy, third trimester: Secondary | ICD-10-CM

## 2023-02-06 DIAGNOSIS — O99214 Obesity complicating childbirth: Secondary | ICD-10-CM | POA: Diagnosis present

## 2023-02-06 DIAGNOSIS — E669 Obesity, unspecified: Secondary | ICD-10-CM | POA: Diagnosis not present

## 2023-02-06 DIAGNOSIS — O480 Post-term pregnancy: Secondary | ICD-10-CM | POA: Diagnosis not present

## 2023-02-06 LAB — URINE DRUG SCREEN, QUALITATIVE (ARMC ONLY)
Amphetamines, Ur Screen: NOT DETECTED
Barbiturates, Ur Screen: NOT DETECTED
Benzodiazepine, Ur Scrn: NOT DETECTED
Cannabinoid 50 Ng, Ur ~~LOC~~: NOT DETECTED
Cocaine Metabolite,Ur ~~LOC~~: NOT DETECTED
MDMA (Ecstasy)Ur Screen: NOT DETECTED
Methadone Scn, Ur: NOT DETECTED
Opiate, Ur Screen: NOT DETECTED
Phencyclidine (PCP) Ur S: NOT DETECTED
Tricyclic, Ur Screen: NOT DETECTED

## 2023-02-06 LAB — TYPE AND SCREEN
ABO/RH(D): O POS
Antibody Screen: NEGATIVE

## 2023-02-06 LAB — CBC
HCT: 37.6 % (ref 36.0–46.0)
Hemoglobin: 13.1 g/dL (ref 12.0–15.0)
MCH: 31 pg (ref 26.0–34.0)
MCHC: 34.8 g/dL (ref 30.0–36.0)
MCV: 89.1 fL (ref 80.0–100.0)
Platelets: 195 10*3/uL (ref 150–400)
RBC: 4.22 MIL/uL (ref 3.87–5.11)
RDW: 12.5 % (ref 11.5–15.5)
WBC: 9.5 10*3/uL (ref 4.0–10.5)
nRBC: 0 % (ref 0.0–0.2)

## 2023-02-06 LAB — ABO/RH: ABO/RH(D): O POS

## 2023-02-06 MED ORDER — OXYTOCIN-SODIUM CHLORIDE 30-0.9 UT/500ML-% IV SOLN: 2.5000 [IU]/h | INTRAVENOUS | Status: DC

## 2023-02-06 MED ORDER — OXYTOCIN-SODIUM CHLORIDE 30-0.9 UT/500ML-% IV SOLN
INTRAVENOUS | Status: AC
  Filled 2023-02-06: qty 500

## 2023-02-06 MED ORDER — OXYTOCIN-SODIUM CHLORIDE 30-0.9 UT/500ML-% IV SOLN: 1.0000 m[IU]/min | INTRAVENOUS | Status: DC

## 2023-02-06 MED ORDER — LIDOCAINE HCL (PF) 1 % IJ SOLN
INTRAMUSCULAR | Status: AC
  Filled 2023-02-06: qty 30

## 2023-02-06 MED ORDER — SOD CITRATE-CITRIC ACID 500-334 MG/5ML PO SOLN: 30.0000 mL | ORAL | Status: DC | PRN

## 2023-02-06 MED ORDER — ZOLPIDEM TARTRATE 5 MG PO TABS: 5.0000 mg | ORAL_TABLET | Freq: Every evening | ORAL | Status: DC | PRN

## 2023-02-06 MED ORDER — HYDROXYZINE HCL 25 MG PO TABS: 50.0000 mg | ORAL_TABLET | Freq: Four times a day (QID) | ORAL | Status: DC | PRN

## 2023-02-06 MED ORDER — MISOPROSTOL 200 MCG PO TABS
ORAL_TABLET | ORAL | Status: AC
  Filled 2023-02-06: qty 4

## 2023-02-06 MED ORDER — AMMONIA AROMATIC IN INHA
RESPIRATORY_TRACT | Status: AC
  Filled 2023-02-06: qty 10

## 2023-02-06 MED ORDER — MISOPROSTOL 25 MCG QUARTER TABLET
25.0000 ug | ORAL_TABLET | Freq: Once | ORAL | Status: AC
  Administered 2023-02-06: 25 ug via ORAL
  Filled 2023-02-06: qty 1

## 2023-02-06 MED ORDER — ACETAMINOPHEN 325 MG PO TABS: 650.0000 mg | ORAL_TABLET | ORAL | Status: DC | PRN

## 2023-02-06 MED ORDER — LIDOCAINE HCL (PF) 1 % IJ SOLN: 30.0000 mL | INTRAMUSCULAR | Status: DC | PRN

## 2023-02-06 MED ORDER — LACTATED RINGERS IV SOLN: INTRAVENOUS | Status: DC

## 2023-02-06 MED ORDER — OXYTOCIN 10 UNIT/ML IJ SOLN
INTRAMUSCULAR | Status: AC
  Filled 2023-02-06: qty 2

## 2023-02-06 MED ORDER — CALCIUM CARBONATE ANTACID 500 MG PO CHEW: 2.0000 | CHEWABLE_TABLET | ORAL | Status: DC | PRN

## 2023-02-06 MED ORDER — FENTANYL CITRATE (PF) 100 MCG/2ML IJ SOLN
50.0000 ug | INTRAMUSCULAR | Status: DC | PRN
  Administered 2023-02-06 (×2): 100 ug via INTRAVENOUS
  Filled 2023-02-06 (×2): qty 2

## 2023-02-06 MED ORDER — ONDANSETRON HCL 4 MG/2ML IJ SOLN
4.0000 mg | Freq: Four times a day (QID) | INTRAMUSCULAR | Status: DC | PRN
  Administered 2023-02-06: 4 mg via INTRAVENOUS
  Filled 2023-02-06: qty 2

## 2023-02-06 MED ORDER — OXYTOCIN BOLUS FROM INFUSION
333.0000 mL | Freq: Once | INTRAVENOUS | Status: AC
  Administered 2023-02-07: 333 mL via INTRAVENOUS

## 2023-02-06 MED ORDER — TERBUTALINE SULFATE 1 MG/ML IJ SOLN: 0.2500 mg | Freq: Once | INTRAMUSCULAR | Status: DC | PRN

## 2023-02-06 MED ORDER — MISOPROSTOL 50MCG HALF TABLET
50.0000 ug | ORAL_TABLET | ORAL | Status: DC | PRN
  Administered 2023-02-06 (×2): 50 ug via ORAL
  Filled 2023-02-06 (×2): qty 1

## 2023-02-06 MED ORDER — LACTATED RINGERS IV SOLN: 500.0000 mL | INTRAVENOUS | Status: DC | PRN

## 2023-02-06 MED ORDER — MISOPROSTOL 50MCG HALF TABLET
50.0000 ug | ORAL_TABLET | Freq: Once | ORAL | Status: AC
  Administered 2023-02-06: 50 ug via VAGINAL
  Filled 2023-02-06: qty 1

## 2023-02-06 MED ORDER — MISOPROSTOL 50MCG HALF TABLET
50.0000 ug | ORAL_TABLET | ORAL | Status: DC | PRN
  Administered 2023-02-06 (×2): 50 ug via VAGINAL
  Filled 2023-02-06 (×2): qty 1

## 2023-02-06 NOTE — Progress Notes (Signed)
  Labor Progress Note   28 y.o. G2P0101 @ [redacted]w[redacted]d  IOL for elevated BMI. Received second dose of misoprostol  four hours ago.  Subjective:  Patient reports feeling cramps in her lower abdomen, reports pain 5/10. Able to still talk and smile through contractions.  Objective:  BP 137/89   Pulse 88   Temp (!) 97.4 F (36.3 C) (Oral)   Resp 16   LMP 05/03/2022  Abd: gravid SVE: 1cm/80%/-2, vertex  EFM: 140, mod variability, pos accels, no decels Toco: Ucs q1-70min   Assessment  G2P0101 @ [redacted]w[redacted]d IOL VSS FHR Cat I   Plan:   1. Third dose of misoprostol  given for cervical ripening 2. Plan repeat SVE within four hours or sooner PRN  All discussed with patient  Lolita Loots CNM, FNP [facility] 02/06/2023  7:22 PM

## 2023-02-06 NOTE — H&P (Signed)
 History and Physical   HPI  Becky Jackson is a 28 y.o. G2P0101 at [redacted]w[redacted]d Estimated Date of Delivery: 02/07/23 by LMP, c/w 11wk u/s, who is being admitted for induction of labor secondary to BMI. Reports good fetal movement. Reports rare intermittent contractions. Denies VB, LOF, HA, visual changes or RUQ pain.   Becky Jackson started prenatal care at 9 weeks. Had consistent and routine care complicated by hx of preterm birth, obesity with BMI 44. First pregnancy with PPROM at 36 weeks. NOB UDS was positive for THC and nicotine , has not had another UDS, today patient denies THC use but dose endorse vaping nicotine . Last growth scan was 01/14/23, EFW was 36%.    OB History  OB History  Gravida Para Term Preterm AB Living  2 1 0 1 0 1  SAB IAB Ectopic Multiple Live Births  0 0 0 0 1    # Outcome Date GA Lbr Len/2nd Weight Sex Type Anes PTL Lv  2 Current           1 Preterm 05/05/14 [redacted]w[redacted]d  2132 g F        PROBLEM LIST  Pregnancy complications or risks: Patient Active Problem List   Diagnosis Date Noted   Labor and delivery, indication for care 02/06/2023   [redacted] weeks gestation of pregnancy 02/03/2023   [redacted] weeks gestation of pregnancy 01/27/2023   Obesity affecting pregnancy in third trimester 11/17/2022   History of preterm delivery 11/17/2022   Supervision of other normal pregnancy, antepartum 08/08/2022   BV (bacterial vaginosis) 06/16/2016   Chronic cholecystitis 10/26/2012    Prenatal labs and studies: ABO, Rh: O/Positive/-- (07/12 1059) Antibody: Negative (07/12 1059) Rubella: 1.76 (07/12 1059) RPR: Non Reactive (10/21 0941)  HBsAg: Negative (07/12 1059)  HIV: Non Reactive (10/21 0941)  HAD:Wzhjupcz/-- (12/16 1046) 1hr GTT 106 Varicella immune GC/CT: neg/neg H&H:  Hemoglobin  Date Value Ref Range Status  02/06/2023 13.1 12.0 - 15.0 g/dL Final  89/78/7975 88.0 11.1 - 15.9 g/dL Final  92/87/7975 87.4 11.1 - 15.9 g/dL Final  96/75/7979 83.4 (H) 12.0 - 15.0  g/dL Final  92/79/7980 85.7 12.0 - 16.0 g/dL Final  96/79/7980 84.1 12.0 - 16.0 g/dL Final   HGB  Date Value Ref Range Status  05/04/2014 12.2 12.0 - 16.0 g/dL Final  89/89/7985 8.3 (L) 12.0 - 16.0 g/dL Final  89/90/7985 9.1 (L) 12.0 - 16.0 g/dL Final  89/91/7985 89.9 (L) 12.0 - 16.0 g/dL Final     Past Medical History:  Diagnosis Date   Acid reflux    Anemia 11/16/2012   Anxiety    Asthma    Asthma    Chlamydia    GERD (gastroesophageal reflux disease)    Hypertension    Irritable bowel syndrome    Obesity      Past Surgical History:  Procedure Laterality Date   CHOLECYSTECTOMY  2014   COLONOSCOPY WITH PROPOFOL  N/A 01/06/2022   Procedure: COLONOSCOPY WITH PROPOFOL ;  Surgeon: Maryruth Ole DASEN, MD;  Location: [facility] ENDOSCOPY;  Service: Endoscopy;  Laterality: N/A;   ESOPHAGOGASTRODUODENOSCOPY (EGD) WITH PROPOFOL  N/A 01/06/2022   Procedure: ESOPHAGOGASTRODUODENOSCOPY (EGD) WITH PROPOFOL ;  Surgeon: Maryruth Ole DASEN, MD;  Location: [facility] ENDOSCOPY;  Service: Endoscopy;  Laterality: N/A;     Medications    Current Discharge Medication List     CONTINUE these medications which have NOT CHANGED   Details  albuterol  (PROVENTIL  HFA;VENTOLIN  HFA) 108 (90 Base) MCG/ACT inhaler Inhale 2 puffs into the lungs  every 4 (four) hours as needed for wheezing or shortness of breath. Qty: 1 Inhaler, Refills: 1    ondansetron  (ZOFRAN -ODT) 4 MG disintegrating tablet Take 1 tablet (4 mg total) by mouth every 6 (six) hours as needed for nausea. Qty: 20 tablet, Refills: 2   Associated Diagnoses: Supervision of other normal pregnancy, antepartum    Prenatal Vit-Fe Fumarate-FA (MULTIVITAMIN-PRENATAL) 27-0.8 MG TABS tablet Take 1 tablet by mouth daily at 12 noon.         Allergies  Augmentin [amoxicillin -pot clavulanate]  Review of Systems  Pertinent items noted in HPI and remainder of comprehensive ROS otherwise negative.  Physical Exam  BP 120/79 (BP Location: Left Arm)    Pulse (!) 105   Temp 98.3 F (36.8 C) (Oral)   Resp 18   LMP 05/03/2022   Lungs:  CTA B Cardio: S1S2, RRR Abd: Soft, gravid, NT Presentation: cephalic DTRs: 2+ B SVE: 1cm/50%/-3, vertex per Leticia RN exam  FHR 130, mod variability, pos accels, no decels Toco rare contractions   Test Results  Results for orders placed or performed during the hospital encounter of 02/06/23 (from the past 24 hours)  CBC     Status: None   Collection Time: 02/06/23 10:13 AM  Result Value Ref Range   WBC 9.5 4.0 - 10.5 K/uL   RBC 4.22 3.87 - 5.11 MIL/uL   Hemoglobin 13.1 12.0 - 15.0 g/dL   HCT 62.3 63.9 - 53.9 %   MCV 89.1 80.0 - 100.0 fL   MCH 31.0 26.0 - 34.0 pg   MCHC 34.8 30.0 - 36.0 g/dL   RDW 87.4 88.4 - 84.4 %   Platelets 195 150 - 400 K/uL   nRBC 0.0 0.0 - 0.2 %   Group B Strep negative  Assessment  G2P0101 at [redacted]w[redacted]d Estimated Date of Delivery: 02/07/23  RNST Planned IOL for elevated BMI GBS neg  Patient Active Problem List   Diagnosis Date Noted   Labor and delivery, indication for care 02/06/2023   [redacted] weeks gestation of pregnancy 02/03/2023   [redacted] weeks gestation of pregnancy 01/27/2023   Obesity affecting pregnancy in third trimester 11/17/2022   History of preterm delivery 11/17/2022   Supervision of other normal pregnancy, antepartum 08/08/2022   BV (bacterial vaginosis) 06/16/2016   Chronic cholecystitis 10/26/2012    Plan  1. Admit to L&D   2. EFM per unit policy 3. Labs : T&S, CBC, RPR, UDS 4. Dr. Leigh notified of patient admission and status  5. Start IOL with cervical ripening (cytotec  PV, 25mcg PO)  Lolita Loots, CNM, FNP 02/06/2023 10:44 AM

## 2023-02-06 NOTE — Progress Notes (Signed)
  Labor Progress Note   28 y.o. G2P0101 @ [redacted]w[redacted]d  IOL for elevated BMI, has now received three doses of misoprostol  for cervical ripening, last dose four hours ago.  Subjective:  Reports pain 8/10 with contractions, pain is primarily to lower abdomen  Objective:  BP 137/89 (BP Location: Left Arm)   Pulse 88   Temp 98.6 F (37 C) (Oral)   Resp 17   LMP 05/03/2022  Abd: gravid SVE: 4cm/100%/-1, vertex  EFM: 135, mod variability, pos accels, no decels Toco: Ucs q67min   Assessment  G2P0101 @ [redacted]w[redacted]d Early labor Mildly elevated Bps, other VSS FHR Cat I   Plan:   1. Expectant management, plan SVE in about 2 hours, will start pitocin  if she stops making cervical change or contractions space out.  All discussed with patient  Becky Jackson CNM, FNP [facility] 02/06/2023  11:38 PM

## 2023-02-06 NOTE — Progress Notes (Signed)
  Labor Progress Note   28 y.o. G2P0101 @ [redacted]w[redacted]d  IOL for elevated BMI, received first dose of misoprostol  about four hours ago.  Subjective:  States she started feeling a little bit of cramping about one hour ago in her lower back. Denies contractions.  Objective:  BP 120/79 (BP Location: Left Arm)   Pulse (!) 105   Temp 98.3 F (36.8 C) (Oral)   Resp 18   LMP 05/03/2022  Abd: gravid SVE: 1cm/60%/-2, softer than last exam  EFM: 130, mod variability, pos accels, no decels Toco: no contractions detected   Assessment  G2P0101 @ [redacted]w[redacted]d IOL for elevated BMI VSS FHR Cat I   Plan:   1. Continue IOL with misoprostol  for cervical ripening. 50mcg PO and 50mcg PV given. 2. Plan repeat SVE in four hours or sooner PRN.  All discussed with patient  Lolita Loots CNM, FNP [facility] 02/06/2023  3:03 PM

## 2023-02-07 ENCOUNTER — Encounter: Payer: Self-pay | Admitting: Obstetrics

## 2023-02-07 ENCOUNTER — Inpatient Hospital Stay: Payer: Managed Care, Other (non HMO) | Admitting: General Practice

## 2023-02-07 DIAGNOSIS — Z3A40 40 weeks gestation of pregnancy: Secondary | ICD-10-CM

## 2023-02-07 DIAGNOSIS — O480 Post-term pregnancy: Secondary | ICD-10-CM

## 2023-02-07 DIAGNOSIS — O99214 Obesity complicating childbirth: Principal | ICD-10-CM

## 2023-02-07 DIAGNOSIS — E669 Obesity, unspecified: Secondary | ICD-10-CM

## 2023-02-07 LAB — RPR: RPR Ser Ql: NONREACTIVE

## 2023-02-07 MED ORDER — WITCH HAZEL-GLYCERIN EX PADS: 1.0000 | MEDICATED_PAD | CUTANEOUS | Status: DC | PRN

## 2023-02-07 MED ORDER — PHENYLEPHRINE 80 MCG/ML (10ML) SYRINGE FOR IV PUSH (FOR BLOOD PRESSURE SUPPORT): 80.0000 ug | PREFILLED_SYRINGE | INTRAVENOUS | Status: DC | PRN

## 2023-02-07 MED ORDER — SENNOSIDES-DOCUSATE SODIUM 8.6-50 MG PO TABS
2.0000 | ORAL_TABLET | Freq: Every day | ORAL | Status: DC
  Administered 2023-02-08 – 2023-02-09 (×2): 2 via ORAL
  Filled 2023-02-07 (×2): qty 2

## 2023-02-07 MED ORDER — ONDANSETRON HCL 4 MG PO TABS: 4.0000 mg | ORAL_TABLET | ORAL | Status: DC | PRN

## 2023-02-07 MED ORDER — DIPHENHYDRAMINE HCL 50 MG/ML IJ SOLN: 12.5000 mg | INTRAMUSCULAR | Status: DC | PRN

## 2023-02-07 MED ORDER — EPHEDRINE 5 MG/ML INJ: 10.0000 mg | INTRAVENOUS | Status: DC | PRN

## 2023-02-07 MED ORDER — COCONUT OIL OIL
1.0000 | TOPICAL_OIL | Status: DC | PRN
  Filled 2023-02-07 (×2): qty 7.5

## 2023-02-07 MED ORDER — DIBUCAINE (PERIANAL) 1 % EX OINT: 1.0000 | TOPICAL_OINTMENT | CUTANEOUS | Status: DC | PRN

## 2023-02-07 MED ORDER — ACETAMINOPHEN 325 MG PO TABS: 650.0000 mg | ORAL_TABLET | ORAL | Status: DC | PRN

## 2023-02-07 MED ORDER — PRENATAL MULTIVITAMIN CH
1.0000 | ORAL_TABLET | Freq: Every day | ORAL | Status: DC
  Administered 2023-02-07 – 2023-02-09 (×3): 1 via ORAL
  Filled 2023-02-07 (×3): qty 1

## 2023-02-07 MED ORDER — IBUPROFEN 600 MG PO TABS
600.0000 mg | ORAL_TABLET | Freq: Four times a day (QID) | ORAL | Status: DC
  Administered 2023-02-07 – 2023-02-09 (×8): 600 mg via ORAL
  Filled 2023-02-07 (×8): qty 1

## 2023-02-07 MED ORDER — LIDOCAINE-EPINEPHRINE (PF) 1.5 %-1:200000 IJ SOLN
INTRAMUSCULAR | Status: DC | PRN
  Administered 2023-02-07: 3 mL via PERINEURAL

## 2023-02-07 MED ORDER — SIMETHICONE 80 MG PO CHEW: 80.0000 mg | CHEWABLE_TABLET | ORAL | Status: DC | PRN

## 2023-02-07 MED ORDER — ZOLPIDEM TARTRATE 5 MG PO TABS: 5.0000 mg | ORAL_TABLET | Freq: Every evening | ORAL | Status: DC | PRN

## 2023-02-07 MED ORDER — ONDANSETRON HCL 4 MG/2ML IJ SOLN: 4.0000 mg | INTRAMUSCULAR | Status: DC | PRN

## 2023-02-07 MED ORDER — ALBUTEROL SULFATE (2.5 MG/3ML) 0.083% IN NEBU: 2.5000 mL | INHALATION_SOLUTION | RESPIRATORY_TRACT | Status: DC | PRN

## 2023-02-07 MED ORDER — TETANUS-DIPHTH-ACELL PERTUSSIS 5-2.5-18.5 LF-MCG/0.5 IM SUSY
0.5000 mL | PREFILLED_SYRINGE | Freq: Once | INTRAMUSCULAR | Status: DC
  Filled 2023-02-07: qty 0.5

## 2023-02-07 MED ORDER — SODIUM CHLORIDE 0.9 % IV SOLN
INTRAVENOUS | Status: DC | PRN
  Administered 2023-02-07 (×2): 4 mL via EPIDURAL

## 2023-02-07 MED ORDER — EPHEDRINE 5 MG/ML INJ
10.0000 mg | INTRAVENOUS | Status: DC | PRN
  Administered 2023-02-07: 10 mg via INTRAVENOUS
  Filled 2023-02-07: qty 5

## 2023-02-07 MED ORDER — DIPHENHYDRAMINE HCL 25 MG PO CAPS: 25.0000 mg | ORAL_CAPSULE | Freq: Four times a day (QID) | ORAL | Status: DC | PRN

## 2023-02-07 MED ORDER — LACTATED RINGERS IV SOLN: 500.0000 mL | Freq: Once | INTRAVENOUS | Status: DC

## 2023-02-07 MED ORDER — FENTANYL-BUPIVACAINE-NACL 0.5-0.125-0.9 MG/250ML-% EP SOLN
12.0000 mL/h | EPIDURAL | Status: DC | PRN
  Administered 2023-02-07: 12 mL/h via EPIDURAL
  Filled 2023-02-07: qty 250

## 2023-02-07 MED ORDER — BENZOCAINE-MENTHOL 20-0.5 % EX AERO
1.0000 | INHALATION_SPRAY | CUTANEOUS | Status: DC | PRN
  Administered 2023-02-07: 1 via TOPICAL
  Filled 2023-02-07: qty 56

## 2023-02-07 NOTE — Lactation Note (Signed)
 This note was copied from a baby's chart. Lactation Consultation Note  Patient Name: Boy Sotiria Keast Unijb'd Date: 02/07/2023 Age:28 hours Reason for consult: Follow-up assessment;Term   Maternal Data    Feeding Mother's Current Feeding Choice: Breast Milk Mom trying to latch baby to left breast in semi cradle hold when room entered, assisted mom to set up more in bed as she was leaning over and elevating baby to breast level, baby eager and rooting, still needs chin pressure to widen latch but able to deeply latch better than earlier and maintain latch, dad shown how to assist with getting deep latch  LATCH Score Latch: Repeated attempts needed to sustain latch, nipple held in mouth throughout feeding, stimulation needed to elicit sucking reflex.  Audible Swallowing: A few with stimulation  Type of Nipple: Everted at rest and after stimulation  Comfort (Breast/Nipple): Soft / non-tender  Hold (Positioning): Assistance needed to correctly position infant at breast and maintain latch.  LATCH Score: 7   Lactation Tools Discussed/Used    Interventions Interventions: Assisted with latch;Hand express;Support pillows;Education  Discharge    Consult Status Consult Status: Follow-up Date: 02/08/23 Follow-up type: In-patient    Aldona JONETTA Converse 02/07/2023, 3:55 PM

## 2023-02-07 NOTE — Lactation Note (Signed)
 This note was copied from a baby's chart. Lactation Consultation Note  Patient Name: Becky Jackson Unijb'd Date: 02/07/2023 Age:28 hours Reason for consult: Initial assessment;Term;Breastfeeding assistance   Maternal Data Has patient been taught Hand Expression?: Yes Does the patient have breastfeeding experience prior to this delivery?: Yes How long did the patient breastfeed?: 3 mths  Feeding Mother's Current Feeding Choice: Breast Milk Baby pulls in lower lip and chin, mom assisted with latch on left breast in football and side lying position, mom can easily express many drops of colostrum, nipples are large, and areola sl firm, baby will open mouth wide but quickly will start sucking on tip of nipple and then pushes out nipple with tongue, after several attempts was able to nurse x approx 5 min on left with sandwiching breast at baby's mouth and gentle chin pressure to widen latch, and then approx 3 min on right breast in football hold, mom will continue to attempt with cues.   LATCH Score Latch: Repeated attempts needed to sustain latch, nipple held in mouth throughout feeding, stimulation needed to elicit sucking reflex.  Audible Swallowing: A few with stimulation  Type of Nipple: Everted at rest and after stimulation  Comfort (Breast/Nipple): Filling, red/small blisters or bruises, mild/mod discomfort (areola sl firm)  Hold (Positioning): Assistance needed to correctly position infant at breast and maintain latch.  LATCH Score: 6   Lactation Tools Discussed/Used  LC name written on white board  Interventions Interventions: Breast feeding basics reviewed;Assisted with latch;Skin to skin;Hand express;Breast compression;Adjust position;Support pillows;Coconut oil;Education  Discharge Pump: Personal WIC Program: No  Consult Status Consult Status: Follow-up Date: 02/07/23 Follow-up type: In-patient    Aldona JONETTA Converse 02/07/2023, 1:48 PM

## 2023-02-07 NOTE — Anesthesia Procedure Notes (Signed)
 Epidural Patient location during procedure: OB Start time: 02/07/2023 12:40 AM End time: 02/07/2023 12:43 AM  Staffing Anesthesiologist: Leavy Ned, MD Performed: anesthesiologist   Preanesthetic Checklist Completed: patient identified, IV checked, site marked, risks and benefits discussed, surgical consent, monitors and equipment checked, pre-op evaluation and timeout performed  Epidural Patient position: sitting Prep: Betadine Patient monitoring: heart rate, continuous pulse ox and blood pressure Approach: midline Location: L4-L5 Injection technique: LOR saline  Needle:  Needle type: Tuohy  Needle gauge: 18 G Needle length: 9 cm and 9 Needle insertion depth: 9 cm Catheter type: closed end flexible Catheter size: 20 Guage Catheter at skin depth: 14 cm Test dose: negative and 1.5% lidocaine  with Epi 1:200 K  Assessment Sensory level: T4 Events: blood not aspirated, no cerebrospinal fluid, injection not painful, no injection resistance, no paresthesia and negative IV test  Additional Notes 1 attempt Pt. Evaluated and documentation done after procedure finished. Patient identified. Risks/Benefits/Options discussed with patient including but not limited to bleeding, infection, nerve damage, paralysis, failed block, incomplete pain control, headache, blood pressure changes, nausea, vomiting, reactions to medication both or allergic, itching and postpartum back pain. Confirmed with bedside nurse the patient's most recent platelet count. Confirmed with patient that they are not currently taking any anticoagulation, have any bleeding history or any family history of bleeding disorders. Patient expressed understanding and wished to proceed. All questions were answered. Sterile technique was used throughout the entire procedure. Please see nursing notes for vital signs. Test dose was given through epidural catheter and negative prior to continuing to dose epidural or start infusion.  Warning signs of high block given to the patient including shortness of breath, tingling/numbness in hands, complete motor block, or any concerning symptoms with instructions to call for help. Patient was given instructions on fall risk and not to get out of bed. All questions and concerns addressed with instructions to call with any issues or inadequate analgesia.    Patient tolerated the insertion well without immediate complications. Reason for block:procedure for pain

## 2023-02-07 NOTE — Progress Notes (Signed)
  Labor Progress Note   28 y.o. [id number] @ [redacted]w[redacted]d  IOL for elevated BMI, has now received three total doses of misoprostol  and has continued contracting and progresses, no further augmentation needed.   Subjective:  Patient now comfortable with epidural  Objective:  BP 108/68   Pulse 76   Temp 99 F (37.2 C) (Axillary)   Resp 18   Ht 5' 2 (1.575 m)   Wt 106.6 kg   LMP 05/03/2022   SpO2 99%   Breastfeeding Unknown   BMI 42.98 kg/m  Abd: gravid SVE: 8cm/100%/0 per RN  EFM: 155, mod variability, pos accels, late decels which responded to position changes but still occurred intermittently. Variability remained good the entire time Toco:    Assessment  G2P1102 @ [redacted]w[redacted]d transition VSS FHR Cat II   Plan:   1. Will continue to change position as needed 2. Anticipate NSVB soon  All discussed with patient  Lolita Loots CNM, FNP  OB/GYN 02/07/2023

## 2023-02-07 NOTE — Discharge Summary (Signed)
 OB Discharge Summary     Patient Name: Becky Jackson DOB: [birth year] MRN: [id number]  Date of admission: 02/06/2023 Delivering MD: Lolita Loots, CNM  Date of Delivery: 02/07/2023 Date of discharge: 02/09/2023  Admitting diagnosis: Labor and delivery, indication for care [O75.9] Intrauterine pregnancy: [redacted]w[redacted]d     Secondary diagnosis: None     Discharge diagnosis: Term Pregnancy Delivered                                                                                                Post partum procedures: NA  Augmentation: Cytotec   Complications: None  Hospital course:  Induction of Labor With Vaginal Delivery   28 y.o. yo [id number] at 107w0d was admitted to the hospital 02/06/2023 for induction of labor.  Indication for induction:  elevated BMI .  Patient had an labor course complicated by intermittent Cat II strip. Membrane Rupture Time/Date: 4:08 AM,02/07/2023  Delivery Method:Vaginal, Spontaneous Operative Delivery:N/A Episiotomy: None Lacerations:  None  Details of delivery can be found in separate delivery note.  Patient had a postpartum course complicated byNA. Ambulating and voiding without difficulty. Breastfeeding independently. Mood is good. Appetite is good. Pain well managed with PRN medication. Her partner is present. They have a lot of family nearby for PP support. Patient is discharged home 02/09/23.  Newborn Data: Birth date:02/07/2023 Birth time:6:13 AM Gender:Female Living status:Living Apgars:9 ,9  Weight:3260 g  Subjective:   Physical exam  Vitals:   02/08/23 0825 02/08/23 1519 02/08/23 2324 02/09/23 0807  BP: (!) 95/53 117/78 126/80 112/77  Pulse: 80 68 82 77  Resp: 18 18 18 18   Temp: 97.6 F (36.4 C) 97.7 F (36.5 C) 97.8 F (36.6 C) 98 F (36.7 C)  TempSrc: Oral Oral Oral Oral  SpO2: 99% 100% 98% 100%  Weight:      Height:       General: alert Breast: soft, non-tender, nipples without breakdown Lochia: appropriate Uterine Fundus:  firm Perineum: declined exam  DVT Evaluation: No evidence of DVT seen on physical exam. Negative Homan's sign. Trace BLE   Labs: Lab Results  Component Value Date   WBC 11.6 (H) 02/08/2023   HGB 11.0 (L) 02/08/2023   HCT 32.2 (L) 02/08/2023   MCV 90.4 02/08/2023   PLT 169 02/08/2023    Discharge instruction: in After Visit Summary.  Medications:  Allergies as of 02/09/2023       Reactions   Augmentin [amoxicillin -pot Clavulanate] Nausea And Vomiting        Medication List     STOP taking these medications    ondansetron  4 MG disintegrating tablet Commonly known as: ZOFRAN -ODT       TAKE these medications    acetaminophen  325 MG tablet Commonly known as: Tylenol  Take 2 tablets (650 mg total) by mouth every 4 (four) hours as needed (for pain scale < 4).   albuterol  108 (90 Base) MCG/ACT inhaler Commonly known as: VENTOLIN  HFA Inhale 2 puffs into the lungs every 4 (four) hours as needed for wheezing or shortness of breath.   ibuprofen  600 MG tablet Commonly known as: ADVIL   Take 1 tablet (600 mg total) by mouth every 6 (six) hours as needed.   multivitamin-prenatal 27-0.8 MG Tabs tablet Take 1 tablet by mouth daily at 12 noon.               Discharge Care Instructions  (From admission, onward)           Start     Ordered   02/09/23 0000  Discharge wound care:       Comments: SHOWER DAILY Wash incision gently with soap and water .  Call office with any drainage, redness, or firmness of the incision.   02/09/23 1051             Activity: Advance as tolerated. Pelvic rest for 6 weeks.   Outpatient follow up:  Follow-up Information     [facility] Follow up.   Specialty: Obstetrics and Gynecology Why: call to schedule a two week telehealth visit and six week in person visit Contact information: [address] [place]  [address] [phone]                  Postpartum  contraception: IUD Rhogam Given postpartum: NA  Rubella vaccine given postpartum: NA Varicella vaccine given postpartum: NA TDaP given antepartum or postpartum: given 11/17/22  Newborn Data: Live born female  Birth Weight:  3260grams APGAR: 9, 9  Newborn Delivery   Birth date/time: 02/07/2023 06:13:00 Delivery type: Vaginal, Spontaneous      Baby Feeding: Breast  Disposition:home with mother   Becky Jackson   [facility]  02/09/2023 10:55 AM

## 2023-02-07 NOTE — Anesthesia Preprocedure Evaluation (Signed)
 Anesthesia Evaluation  Patient identified by MRN, date of birth, ID band Patient awake    Reviewed: Allergy & Precautions, NPO status , Patient's Chart, lab work & pertinent test results  Airway Mallampati: III  TM Distance: >3 FB Neck ROM: full    Dental  (+) Chipped   Pulmonary asthma    Pulmonary exam normal        Cardiovascular Exercise Tolerance: Good hypertension, negative cardio ROS Normal cardiovascular exam     Neuro/Psych  PSYCHIATRIC DISORDERS Anxiety        GI/Hepatic ,GERD  Medicated,,  Endo/Other    Class 3 obesity  Renal/GU   negative genitourinary   Musculoskeletal   Abdominal   Peds  Hematology negative hematology ROS (+)   Anesthesia Other Findings Past Medical History: No date: Acid reflux 11/16/2012: Anemia No date: Anxiety No date: Asthma No date: Asthma No date: Chlamydia No date: GERD (gastroesophageal reflux disease) No date: Hypertension No date: Irritable bowel syndrome No date: Obesity  Past Surgical History: 2014: CHOLECYSTECTOMY 01/06/2022: COLONOSCOPY WITH PROPOFOL ; N/A     Comment:  Procedure: COLONOSCOPY WITH PROPOFOL ;  Surgeon:               Maryruth Ole DASEN, MD;  Location: [facility] ENDOSCOPY;                Service: Endoscopy;  Laterality: N/A; 01/06/2022: ESOPHAGOGASTRODUODENOSCOPY (EGD) WITH PROPOFOL ; N/A     Comment:  Procedure: ESOPHAGOGASTRODUODENOSCOPY (EGD) WITH               PROPOFOL ;  Surgeon: Maryruth Ole DASEN, MD;  Location:               [facility] ENDOSCOPY;  Service: Endoscopy;  Laterality: N/A;     Reproductive/Obstetrics (+) Pregnancy                             Anesthesia Physical Anesthesia Plan  ASA: 3  Anesthesia Plan: Epidural   Post-op Pain Management:    Induction:   PONV Risk Score and Plan:   Airway Management Planned: Natural Airway  Additional Equipment:   Intra-op Plan:   Post-operative Plan:    Informed Consent: I have reviewed the patients History and Physical, chart, labs and discussed the procedure including the risks, benefits and alternatives for the proposed anesthesia with the patient or authorized representative who has indicated his/her understanding and acceptance.     Dental Advisory Given  Plan Discussed with: Anesthesiologist  Anesthesia Plan Comments: (Patient reports no bleeding problems and no anticoagulant use.   Patient consented for risks of anesthesia including but not limited to:  - adverse reactions to medications - risk of bleeding, infection and or nerve damage from epidural that could lead to paralysis - risk of headache or failed epidural - nerve damage due to positioning - that if epidural is used for C-section that there is a chance of epidural failure requiring spinal placement or conversion to GA - Damage to heart, brain, lungs, other parts of body or loss of life  Patient voiced understanding and assent.)       Anesthesia Quick Evaluation

## 2023-02-08 LAB — CBC
HCT: 32.2 % — ABNORMAL LOW (ref 36.0–46.0)
Hemoglobin: 11 g/dL — ABNORMAL LOW (ref 12.0–15.0)
MCH: 30.9 pg (ref 26.0–34.0)
MCHC: 34.2 g/dL (ref 30.0–36.0)
MCV: 90.4 fL (ref 80.0–100.0)
Platelets: 169 10*3/uL (ref 150–400)
RBC: 3.56 MIL/uL — ABNORMAL LOW (ref 3.87–5.11)
RDW: 12.5 % (ref 11.5–15.5)
WBC: 11.6 10*3/uL — ABNORMAL HIGH (ref 4.0–10.5)
nRBC: 0 % (ref 0.0–0.2)

## 2023-02-08 NOTE — Lactation Note (Signed)
 This note was copied from a baby's chart. Lactation Consultation Note  Patient Name: Becky Jackson Date: 02/08/2023 Age:28 hours Reason for consult: Follow-up assessment;Term   Maternal Data Follow up assessment w/ a 28hr old baby Becky Becky Jackson.  Patient stated that she feels feedings are going good and he is cluster feeding now.  Feeding Mother's Current Feeding Choice: Breast Milk  LC walked into the room to infant eating and finishing a nursing session.  LC informed patient that she would like to observe a feeding at the breast prior to their discharge.  Interventions Interventions: Breast feeding basics reviewed;Education;CDC Guidelines for Breast Pump Cleaning  Discharge Discharge Education: Engorgement and breast care;Outpatient recommendation  Education on engorgement prevention/treatment was discussed as well as breastmilk storage guidelines.  LC provided patient with a handout on breastmilk storage guidelines from [facility]. [facility] outpatient lactation services phone number written on the white board in the room.  Patient verbalized understanding.  Consult Status Consult Status: Follow-up Date: 02/08/23 Follow-up type: In-patient    Ruble Buttler S Miranda Garber 02/08/2023, 11:50 AM

## 2023-02-08 NOTE — Lactation Note (Signed)
 This note was copied from a baby's chart. Lactation Consultation Note  Patient Name: Becky Jackson Date: 02/08/2023 Age:28 hours Reason for consult: Follow-up assessment;Mother's request;Term;Breastfeeding assistance   Maternal Data LC called to room by patient to observe a feeding session.  Feeding Mother's Current Feeding Choice: Breast Milk  LC assisted patient with different positions at the breast and other tips/tricks to optimize the deepest latch.  Infant is very interested in feeding.  Patient is able to independently get infant to the breast.  She hand expresses lots of colostrum.  Patient is not endorsing any pain and feeling strong cramps during a feeding session.   LC did observe that infant can have a shallow latch but it is able to be fixed when he brought in closer to patient.  LC observed infant eat off of both breast.  LATCH Score Latch: Grasps breast easily, tongue down, lips flanged, rhythmical sucking.  Audible Swallowing: Spontaneous and intermittent  Type of Nipple: Everted at rest and after stimulation  Comfort (Breast/Nipple): Soft / non-tender  Hold (Positioning): Assistance needed to correctly position infant at breast and maintain latch.  LATCH Score: 9  Interventions Interventions: Breast feeding basics reviewed;Assisted with latch;Hand express;Breast compression;Adjust position;Support pillows;Position options;Education;Pace feeding  Discharge Discharge Education: Engorgement and breast care;Outpatient recommendation  Consult Status Consult Status: Complete Date: 02/08/23 Follow-up type: Call as needed  [facility] provided a report to RN/NP.   Becky Jackson 02/08/2023, 2:03 PM

## 2023-02-08 NOTE — Anesthesia Postprocedure Evaluation (Signed)
 Anesthesia Post Note  Patient: Becky Jackson  Procedure(s) Performed: AN AD HOC LABOR EPIDURAL  Patient location during evaluation: Mother Baby Anesthesia Type: Epidural Level of consciousness: awake and alert Pain management: pain level controlled Vital Signs Assessment: post-procedure vital signs reviewed and stable Respiratory status: spontaneous breathing, nonlabored ventilation and respiratory function stable Cardiovascular status: stable Postop Assessment: no headache, no backache, patient able to bend at knees and able to ambulate Anesthetic complications: no   No notable events documented.   Last Vitals:  Vitals:   02/07/23 2322 02/08/23 0825  BP: 127/79 (!) 95/53  Pulse: 89 80  Resp: 18 17  Temp: (!) 36.4 C 36.4 C  SpO2: 99% 99%    Last Pain:  Vitals:   02/08/23 0825  TempSrc: Oral  PainSc:                  [name] POUR Kryslyn Helbig

## 2023-02-08 NOTE — Discharge Instructions (Signed)
 Call office if you have any of the following:  -Persistent headache or visual changes (possible high blood pressure) -Fever >101.0 F or chills (possible infection) -Breast concerns (engorgement, mastitis) -Excessive vaginal bleeding (soaking through more than one pad in 1 hr x 2 hr) -Incision drainage/redness/increased pain/warmth at site (possible infection)  -Leg pain or redness (possible blood clot) -Depression/anxiety increased symptoms 2 weeks after delivery  Activity & Hygiene: -Do not lift > 10 lbs for 6 weeks.  -No intercourse or tampons for 6 weeks.  -No swimming pools, hot tubs or tub baths- showers only for 6 weeks **No driving for 1-2 weeks or while taking pain medication after c-section  -It is normal to bleed for up to 6 weeks. You should not soak through more than 1 pad in 1 hour x 2 hours.  Breastfeeding: -Continue prenatal vitamin.  -Increase calories and fluids.  -Your milk will come in, in the next couple of days (right now it is colostrum).  -You may have a slight fever when your milk comes in, but it should go away on its own.   -If it does not, and rises above 101 F please call the doctor.  -You will also feel achy and your breasts will be firm. They will also start to leak.  *For breastfeeding concerns, the lactation consultant can be reached at [phone]  Not Breastfeeding: -Avoid breast stimulation and wear supportive bra -ICE helps decrease inflammation and pain -Express milk for comfort by hand, do not empty breast  Postpartum blues: -feelings of happy one minute and sad another minute are normal for the first few weeks. -if it gets worse please let your doctor know. It is very common!!

## 2023-02-08 NOTE — Progress Notes (Signed)
 [facility]  Subjective:  Doing well postpartum day 1; she is tolerating regular diet, her pain is controlled with PO medication, she is ambulating and voiding without difficulty. She reports breastfeeding is going well, however, discharge to home is delayed as newborn has not yet urinated.  Objective:  Vital signs in last 24 hours: Temp:  [97.3 F (36.3 C)-97.7 F (36.5 C)] 97.7 F (36.5 C) (01/12 1519) Pulse Rate:  [68-89] 68 (01/12 1519) Resp:  [18] 18 (01/12 1519) BP: (95-127)/(53-79) 117/78 (01/12 1519) SpO2:  [99 %-100 %] 100 % (01/12 1519)    General: NAD Pulmonary: no increased work of breathing Abdomen: non-distended, non-tender, fundus firm at level of umbilicus Extremities: no edema, no erythema, no tenderness  Results for orders placed or performed during the hospital encounter of 02/06/23 (from the past 72 hours)  CBC     Status: None   Collection Time: 02/06/23 10:13 AM  Result Value Ref Range   WBC 9.5 4.0 - 10.5 K/uL   RBC 4.22 3.87 - 5.11 MIL/uL   Hemoglobin 13.1 12.0 - 15.0 g/dL   HCT 62.3 63.9 - 53.9 %   MCV 89.1 80.0 - 100.0 fL   MCH 31.0 26.0 - 34.0 pg   MCHC 34.8 30.0 - 36.0 g/dL   RDW 87.4 88.4 - 84.4 %   Platelets 195 150 - 400 K/uL   nRBC 0.0 0.0 - 0.2 %    Comment: Performed at [facility], [address] [place], [place] [address]  Type and screen     Status: None   Collection Time: 02/06/23 10:13 AM  Result Value Ref Range   ABO/RH(D) O POS    Antibody Screen NEG    Sample Expiration      02/09/2023,2359 Performed at [facility], [address] [place], [place] [address]   RPR     Status: None   Collection Time: 02/06/23 10:13 AM  Result Value Ref Range   RPR Ser Ql NON REACTIVE NON REACTIVE    Comment: Performed at [facility], [address] [place], [place] [address]  Urine Drug Screen, Qualitative ([facility] only)     Status: None   Collection Time: 02/06/23 10:13 AM  Result Value Ref Range    Tricyclic, Ur Screen NONE DETECTED NONE DETECTED   Amphetamines, Ur Screen NONE DETECTED NONE DETECTED   MDMA (Ecstasy)Ur Screen NONE DETECTED NONE DETECTED   Cocaine Metabolite,Ur [place] NONE DETECTED NONE DETECTED   Opiate, Ur Screen NONE DETECTED NONE DETECTED   Phencyclidine (PCP) Ur S NONE DETECTED NONE DETECTED   Cannabinoid 50 Ng, Ur [place] NONE DETECTED NONE DETECTED   Barbiturates, Ur Screen NONE DETECTED NONE DETECTED   Benzodiazepine, Ur Scrn NONE DETECTED NONE DETECTED   Methadone Scn, Ur NONE DETECTED NONE DETECTED    Comment: (NOTE) Tricyclics + metabolites, urine    Cutoff 1000 ng/mL Amphetamines + metabolites, urine  Cutoff 1000 ng/mL MDMA (Ecstasy), urine              Cutoff 500 ng/mL Cocaine Metabolite, urine          Cutoff 300 ng/mL Opiate + metabolites, urine        Cutoff 300 ng/mL Phencyclidine (PCP), urine         Cutoff 25 ng/mL Cannabinoid, urine                 Cutoff 50 ng/mL Barbiturates + metabolites, urine  Cutoff 200 ng/mL Benzodiazepine, urine  Cutoff 200 ng/mL Methadone, urine                   Cutoff 300 ng/mL  The urine drug screen provides only a preliminary, unconfirmed analytical test result and should not be used for non-medical purposes. Clinical consideration and professional judgment should be applied to any positive drug screen result due to possible interfering substances. A more specific alternate chemical method must be used in order to obtain a confirmed analytical result. Gas chromatography / mass spectrometry (GC/MS) is the preferred confirm atory method. Performed at [facility], [address] [place], [place] [address]   ABO/Rh     Status: None   Collection Time: 02/06/23 11:37 AM  Result Value Ref Range   ABO/RH(D)      O POS Performed at [facility], [address] [place], [place] [address]   CBC     Status: Abnormal   Collection Time: 02/08/23  6:18 AM  Result Value Ref Range   WBC 11.6 (H)  4.0 - 10.5 K/uL   RBC 3.56 (L) 3.87 - 5.11 MIL/uL   Hemoglobin 11.0 (L) 12.0 - 15.0 g/dL   HCT 67.7 (L) 63.9 - 53.9 %   MCV 90.4 80.0 - 100.0 fL   MCH 30.9 26.0 - 34.0 pg   MCHC 34.2 30.0 - 36.0 g/dL   RDW 87.4 88.4 - 84.4 %   Platelets 169 150 - 400 K/uL   nRBC 0.0 0.0 - 0.2 %    Comment: Performed at [facility], [address], [place], [place] [address]    Assessment:   28 y.o. [phone] postpartum day # 1, lactating  Plan:    1) Acute blood loss anemia not clinically significant for this admission - hemodynamically stable and asymptomatic - po ferrous sulfate  2) Blood Type --/--/O POS Performed at [facility], [address] [place], [place] [address]  [phone]) / Rubella 1.76 (07/12 1059) / Varicella Immune  3) TDAP status : given antepartum  4) Feeding plan : breastfeeding  5)  Education given regarding options for contraception, as well as compatibility with breast feeding if applicable.  Patient plans on IUD/Paragard for contraception.  6) Disposition: continue current care with discharge tomorrow    Slater Rains, CNM [facility] 02/08/2023 6:37 PM

## 2023-02-09 MED ORDER — IBUPROFEN 600 MG PO TABS: 600.0000 mg | ORAL_TABLET | Freq: Four times a day (QID) | ORAL | Status: DC | PRN

## 2023-02-09 MED ORDER — ACETAMINOPHEN 325 MG PO TABS: 650.0000 mg | ORAL_TABLET | ORAL | Status: DC | PRN

## 2023-02-09 NOTE — Progress Notes (Signed)
 Pt discharged with infant.  Discharge instructions, prescriptions and follow up appointment given to and reviewed with pt. Pt verbalized understanding. Escorted out by auxillary.

## 2023-02-20 ENCOUNTER — Telehealth (INDEPENDENT_AMBULATORY_CARE_PROVIDER_SITE_OTHER): Payer: Managed Care, Other (non HMO)

## 2023-02-20 NOTE — Progress Notes (Signed)
   Virtual Visit via Video Note   I connected withNAME@  on 02/20/23 3:33 PM by a video enabled telemedicine application and verified that I am speaking with the correct person using two identifiers.   Location: Patient: home Provider: [facility]   I discussed the limitations of evaluation and management by telemedicine and the availability of in person appointments. The patient expressed understanding and agreed to proceed. Subjective:    Subjective  Becky Jackson is a 28 y.o. female who presents for a postpartum visit. She is2 weeks  postpartum following a spontaneous vaginal delivery. I have fully reviewed the prenatal and intrapartum course. The delivery was at [redacted]w[redacted]d gestational age.  Anesthesia: epidural.    Postpartum course has been normal. She has no pain and no other concerns. Baby's course has been normal. Baby is feeding by   pumped breast milk in bottle . Bleeding thin lochia. Bowel function is normal. Bladder function is normal.  .  Contraception method: IUD.- Mirena Postpartum depression screening: EPDS 0     Review of Systems Pertinent positives noted in HPI. Remainder of comprehensive ROS otherwise negative.     Objective:      Objective  There were no vitals taken for this visit.  General:  alert, cooperative, and no distress        Assessment:    1. Encounter for screening for maternal depression - Screening Negative today. Will rescreen at 6 week postpartum visit.     2. Postpartum state - Overall doing well. Continue routine postpartum home care.    3. Lactating mother -  Pumping and bottle feeding. Has good supply, baby gaining weight appropriately.  4. Contraception - Desires Mirena IUD.   Plan:      Plan - 6 week postpartum visit with Mirena IUD insertion.   I discussed the assessment and treatment plan with the patient. The patient was provided an opportunity to ask questions and all were answered. The patient agreed with the plan and  demonstrated an understanding of the instructions.   The patient was advised to call back or seek an in-person evaluation if the symptoms worsen or if the condition fails to improve as anticipated.   I provided 10 minutes of non-face-to-face time during this encounter.     Lindalou Hose Johnatan Baskette, CNM

## 2023-03-18 ENCOUNTER — Ambulatory Visit: Payer: Managed Care, Other (non HMO) | Admitting: Licensed Practical Nurse

## 2023-03-18 ENCOUNTER — Telehealth: Payer: Self-pay | Admitting: Licensed Practical Nurse

## 2023-03-18 NOTE — Telephone Encounter (Signed)
Reached out to pt to reschedule 6 week pp visit that was scheduled on 03/18/2023 at 11:15 with LMD.  Was able to reschedule the appt to 03/23/2023 with [place]man Livings at 8:55.

## 2023-03-23 ENCOUNTER — Ambulatory Visit (INDEPENDENT_AMBULATORY_CARE_PROVIDER_SITE_OTHER): Payer: Managed Care, Other (non HMO) | Admitting: Obstetrics

## 2023-03-23 ENCOUNTER — Encounter: Payer: Self-pay | Admitting: Obstetrics

## 2023-03-23 DIAGNOSIS — Z30430 Encounter for insertion of intrauterine contraceptive device: Secondary | ICD-10-CM

## 2023-03-23 MED ORDER — LEVONORGESTREL 20 MCG/DAY IU IUD
1.0000 | INTRAUTERINE_SYSTEM | Freq: Once | INTRAUTERINE | Status: AC
  Administered 2023-03-23: 1 via INTRAUTERINE

## 2023-03-23 NOTE — Progress Notes (Signed)
 Post Partum Visit Note  Becky Jackson is a 28 y.o. G13P1102 female who presents for a postpartum visit. She is 6 weeks postpartum following a normal spontaneous vaginal delivery.  I have fully reviewed the prenatal and intrapartum course. The delivery was at 40 gestational weeks.  Anesthesia: epidural. Postpartum course has been uncomplicated. Baby is doing well. Baby is feeding by  breast . Bleeding stopped after 2-3 weeks. Bowel function is normal. Bladder function is normal. Patient is not sexually active. Contraception method: plans  IUD. Postpartum depression screening: negative.   The pregnancy intention screening data noted above was reviewed. Potential methods of contraception were discussed. The patient elected to proceed with No data recorded.    Health Maintenance Due  Topic Date Due   Pneumococcal Vaccine 64-68 Years old (1 of 2 - PCV) Never done   INFLUENZA VACCINE  08/28/2022   COVID-19 Vaccine (1 - 2024-25 season) Never done    The following portions of the patient's history were reviewed and updated as appropriate: allergies, current medications, past medical history, and problem list.  Review of Systems Pertinent items are noted in HPI.  Objective:  Ht 5\' 3"  (1.6 m)   Wt 223 lb (101.2 kg)   Breastfeeding Yes   BMI 39.50 kg/m    General:  alert, cooperative, and appears stated age   Breasts:  normal  Lungs: clear to auscultation bilaterally  Heart:  regular rate and rhythm, S1, S2 normal, no murmur, click, rub or gallop  Abdomen: soft, non-tender; bowel sounds normal; no masses,  no organomegaly   Wound : N/A  GU exam:  normal       Procedure Note    Becky Jackson desires reversible long-term contraception. We have thoroughly reviewed the risks, benefits, and alternatives, and she has elected to proceed with Mirena insertion.   Objective BP 122/79   Pulse 94   Ht 5\' 3"  (1.6 m)   Wt 223 lb (101.2 kg)   Breastfeeding Yes   BMI 39.50 kg/m   Pelvic exam:  normal external genitalia, vulva, vagina, cervix, uterus and adnexa.   Procedure Note Consent was obtained prior to the procedure. A bimanual exam was performed to determine the position of the uterus. A sterile speculum was placed in the vagina, and the cervix was visualized. Betadine was applied to the cervix followed by 4% lidocaine. An Becky Jackson was placed on the anterior lip of the cervix, and gentle traction was applied to straighten and stabilize it. The uterus was sounded to about 9 cm. The IUD was inserted to the appropriate depth and the insertion tool was removed. The strings were trimmed to about 3 cm. Bleeding was minimal. Becky Jackson tolerated the procedure well. Post-procedure care and warning signs were reviewed with Becky Jackson.   Assessment:   Normal postpartum exam.  IUD placement  Plan:   Essential components of care per ACOG recommendations:  1.  Mood and well being: Patient with negative depression screening today. Reviewed local resources for support.  - Patient tobacco use? No.   - hx of drug use? No.    2. Infant care and feeding:  -Patient currently breastmilk feeding? Yes   3. Sexuality, contraception and birth spacing - Patient does not want a pregnancy in the next year.  Desired family size is 2 children.  - Reviewed reproductive life planning. Reviewed contraceptive methods based on pt preferences and effectiveness.  Patient desired IUD or IUS today.    4. Sleep and fatigue -Encouraged family/partner/community  support of 4 hrs of uninterrupted sleep to help with mood and fatigue  5. Physical Recovery  - Discussed patients delivery and complications. She describes her labor as good. - Patient had a  NSVD . Patient had a an intact perineum. Perineal healing reviewed. Patient expressed understanding - Patient has urinary incontinence? No. - Patient is safe to resume physical and sexual activity  6.  Health Maintenance - HM due items addressed N/A - Last pap smear   Diagnosis  Date Value Ref Range Status  08/08/2022      - Negative for intraepithelial lesion or malignancy (NILM)   Pap smear not done at today's visit.  -Breast Cancer screening indicated? No.   7. Chronic Disease/Pregnancy Condition follow up: None  - PCP follow up  Becky Jackson, CNM [facility]

## 2023-10-15 ENCOUNTER — Encounter: Payer: Self-pay | Admitting: Licensed Practical Nurse

## 2023-10-16 MED ORDER — METRONIDAZOLE 500 MG PO TABS: 500.0000 mg | ORAL_TABLET | Freq: Two times a day (BID) | ORAL | 0 refills | Status: AC
